# Patient Record
Sex: Female | Born: 1995 | Hispanic: Yes | Marital: Single | Smoking: Never smoker
Health system: Southern US, Community
[De-identification: ages and names within clinical notes are randomized; demographics above are authoritative.]

## PROBLEM LIST (undated history)

## (undated) DIAGNOSIS — Z8659 Personal history of other mental and behavioral disorders: Secondary | ICD-10-CM

## (undated) DIAGNOSIS — F4323 Adjustment disorder with mixed anxiety and depressed mood: Secondary | ICD-10-CM

## (undated) DIAGNOSIS — F419 Anxiety disorder, unspecified: Secondary | ICD-10-CM

## (undated) DIAGNOSIS — J3089 Other allergic rhinitis: Secondary | ICD-10-CM

## (undated) DIAGNOSIS — Z0282 Encounter for adoption services: Secondary | ICD-10-CM

## (undated) DIAGNOSIS — G479 Sleep disorder, unspecified: Secondary | ICD-10-CM

## (undated) DIAGNOSIS — L700 Acne vulgaris: Secondary | ICD-10-CM

## (undated) DIAGNOSIS — N926 Irregular menstruation, unspecified: Secondary | ICD-10-CM

## (undated) HISTORY — DX: Sleep disorder, unspecified: G47.9

## (undated) HISTORY — DX: Adjustment disorder with mixed anxiety and depressed mood: F43.23

## (undated) HISTORY — PX: ESOPHAGOGASTRODUODENOSCOPY ENDOSCOPY: SHX5814

## (undated) HISTORY — DX: Other allergic rhinitis: J30.89

## (undated) HISTORY — DX: Irregular menstruation, unspecified: N92.6

## (undated) HISTORY — DX: Anxiety disorder, unspecified: F41.9

## (undated) HISTORY — DX: Acne vulgaris: L70.0

## (undated) HISTORY — DX: Encounter for adoption services: Z02.82

## (undated) HISTORY — DX: Personal history of other mental and behavioral disorders: Z86.59

---
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
## 2017-09-16 ENCOUNTER — Encounter: Payer: Self-pay | Admitting: Family Medicine

## 2017-09-16 ENCOUNTER — Ambulatory Visit (INDEPENDENT_AMBULATORY_CARE_PROVIDER_SITE_OTHER): Admitting: Family Medicine

## 2017-09-16 VITALS — BP 100/64 | HR 74 | Ht 65.0 in | Wt 159.4 lb

## 2017-09-16 DIAGNOSIS — F4010 Social phobia, unspecified: Secondary | ICD-10-CM

## 2017-09-16 DIAGNOSIS — G479 Sleep disorder, unspecified: Secondary | ICD-10-CM

## 2017-09-16 DIAGNOSIS — F40243 Fear of flying: Secondary | ICD-10-CM | POA: Insufficient documentation

## 2017-09-16 DIAGNOSIS — Z8659 Personal history of other mental and behavioral disorders: Secondary | ICD-10-CM

## 2017-09-16 DIAGNOSIS — E663 Overweight: Secondary | ICD-10-CM | POA: Diagnosis not present

## 2017-09-16 DIAGNOSIS — J3089 Other allergic rhinitis: Secondary | ICD-10-CM | POA: Diagnosis not present

## 2017-09-16 MED ORDER — ALPRAZOLAM ER 1 MG PO TB24: ORAL_TABLET | ORAL | 0 refills | Status: DC

## 2017-09-16 MED ORDER — ESCITALOPRAM OXALATE 20 MG PO TABS: 20.0000 mg | ORAL_TABLET | Freq: Every day | ORAL | 3 refills | Status: DC

## 2017-09-16 NOTE — Progress Notes (Signed)
Impression and Recommendations:    1. Social anxiety disorder   2. Sleep disturbance   3. Over weight:  slight inc BMI   4. Mild Environmental and seasonal allergies   5. Fear of flying   6. Hx of psychiatric hospitalization     1. Anxiety Management - Patient tolerates her 20 mg dose of Lexapro well.  Continue as prescribed. - No changes made today.  Refills provided on Lexapro.  - Encouraged continued exercise, attending counseling, adding in meditation or prayer, deep breathing, adequate sleep, healthy dietary habits, along with continued medication to manage all facets of mood and anxiety.  - Patient is going on an 8 hour plane flight soon to travel abroad to [place].  Due to patient's concerns about unmanageable flight anxiety, Xanax prescribed.  Counseled patient to be careful and prudent with use, reminding her of risks, benefits, and potential side-effects of medication.  2. Seasonal Allergies - Advised the patient to begin using her neti pot to perform sinus rinses BID, followed by flonase BID (one spray to each nostril).  Advised that the patient may also incorporate allegra or claritin PRN.   3. BMI Counseling Explained to patient what BMI refers to, and what it means medically.    Told patient to think about it as a "medical risk stratification measurement" and how increasing BMI is associated with increasing risk/ or worsening state of various diseases such as hypertension, hyperlipidemia, diabetes, premature OA, depression etc.  American Heart Association guidelines for healthy diet, basically Mediterranean diet, and exercise guidelines of 30 minutes 5 days per week or more discussed in detail.  Health counseling performed.  All questions answered.  4. Lifestyle & Dietary Habits - Advised patient to continue working toward exercising to improve health.    - Encouraged patient to continue with 15 minutes of activity daily this summer.  Recommended that the patient  eventually strive for at least 150 minutes of moderate cardiovascular activity per week according to guidelines established by the [facility].   - Healthy dietary habits encouraged, including low-carb, and high amounts of lean protein in diet.   - Patient should also consume adequate amounts of water - half of body weight in oz of water per day   Education and routine counseling performed. Handouts provided.  5. Follow-Up - Continue to return for regularly scheduled chronic follow-up.  - Patient knows that she may return to the clinic as needed for any other concerns that arise.    Meds ordered this encounter  Medications  . ALPRAZolam (XANAX XR) 1 MG 24 hr tablet    Sig: 1 to 3 tabs prior to flight    Dispense:  6 tablet    Refill:  0  . escitalopram (LEXAPRO) 20 MG tablet    Sig: Take 1 tablet (20 mg total) by mouth daily.    Dispense:  90 tablet    Refill:  3    Generic OK    Gross side effects, risk and benefits, and alternatives of medications and treatment plan in general discussed with patient.  Patient is aware that all medications have potential side effects and we are unable to predict every side effect or drug-drug interaction that may occur.   Patient will call with any questions prior to using medication if they have concerns.  Expresses verbal understanding and consents to current therapy and treatment regimen.  No barriers to understanding were identified.  Red flag symptoms and signs discussed in detail.  Patient expressed understanding regarding what to do in case of emergency\urgent symptoms  Please see AVS handed out to patient at the end of our visit for further patient instructions/ counseling done pertaining to today's office visit.   Return for Six-month follow-up weight loss, mood, sleep disorder, allergies.     Note: This document was prepared using Dragon voice recognition software and may include unintentional dictation errors.  This document serves as a  record of services personally performed by Thomasene Loteborah Skyelar Halliday, DO. It was created on her behalf by Peggye FothergillKatherine Galloway, a trained medical scribe. The creation of this record is based on the scribe's personal observations and the provider's statements to them.   I have reviewed the above medical documentation for accuracy and completeness and I concur.  Thomasene LotDeborah Nyimah Shadduck 09/22/17 8:30 PM   --------------------------------------------------------------------------------------------------------------------------------------------   Subjective:    CC:  Chief Complaint  Patient presents with  . Follow-up    HPI: Sheri Kline is a 22 y.o. female who [facility] follow-up of mood.   Lifestyle & General Health Patient notes that she's been good.  Not much is new, but she has lost weight; down to 155-156 lbs.  Notes that she's lost 5 or 6 pounds, maybe even a little more.  Weighed 167 lbs a year ago.  Feels that a lot of her weight loss was due to school, finals, being at school, feeling lazy, not wanting to walk to the cafeteria, etc.  Notes she's not feeling as hungry anymore.  She works in produce, often lifting and walking.  Notes that work has been a little difficult lately, mainly due to issues with coworkers.  Other than work, she started going to the gym at school 2-3 times per week.  Anxiety Management Continues taking Lexapro, and is tolerating her 20 mg dose well.  She was also visiting a counselor to help manage her anxiety while she was at school.  Is going on an 8 hour plane flight soon to travel abroad to [place], and is concerned about flight anxiety.  Sleep She is still taking melatonin occasionally to help her sleep, but not every night.  Allergies Has not had any allergy spells in a while.    [facility] 09/16/2017 08/24/2016 02/15/2016  Decreased Interest 0 0 0  Down, Depressed, Hopeless 2 1 1   PHQ - 2 Score  2 1 1   Altered sleeping 0 1 0  Tired, decreased energy 0 1 0  Change in appetite 1 1 0  Feeling bad or failure about yourself  2 0 1  Trouble concentrating 0 0 0  Moving slowly or fidgety/restless 0 0 0  Suicidal thoughts 2 1 1   PHQ-9 Score 7 5 3   Difficult doing work/chores Somewhat difficult - -     GAD 7 : Generalized Anxiety Score 09/16/2017 08/24/2016 02/15/2016  Nervous, Anxious, on Edge 1 1 1   Control/stop worrying 1 1 0  Worry too much - different things 1 1 1   Trouble relaxing 0 0 0  Restless 0 3 0  Easily annoyed or irritable 1 1 1   Afraid - awful might happen 0 0 0  Total GAD 7 Score 4 7 3   Anxiety Difficulty - Somewhat difficult Not difficult at all     Wt Readings from Last 3 Encounters:  09/16/17 159 lb 6.4 oz (72.3 kg) (87 %, Z= 1.12)*  08/24/16 167 lb (75.8 kg) (92 %, Z= 1.38)*  02/15/16 162 lb 1.6 oz (73.5 kg) (90 %, Z= 1.31)*   * Growth percentiles are based on CDC (Girls, 2-20 Years) data.   BP Readings from Last 3 Encounters:  09/16/17 100/64  08/24/16 102/62  02/15/16 103/65   Pulse Readings from Last 3 Encounters:  09/16/17 74  08/24/16 80  02/15/16 89   BMI Readings from Last 3 Encounters:  09/16/17 26.53 kg/m (85 %, Z= 1.06)*  08/24/16 27.58 kg/m (90 %, Z= 1.28)*  02/15/16 26.77 kg/m (88 %, Z= 1.20)*   * Growth percentiles are based on CDC (Girls, 2-20 Years) data.         Patient Care Team    Relationship Specialty Notifications Start End  Thomasene Lot, DO PCP - General Family Medicine  11/09/15      Patient Active Problem List   Diagnosis Date Noted  . 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 Over weight:  slight inc BMI 09/10/2016  . 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 Irregular menstrual cycle 11/11/2015  . Mild Environmental and seasonal allergies 11/11/2015  . Amenorrhea 11/18/2014  . Acne vulgaris 09/30/2014  . BMI (body mass index), pediatric, 85% to less than 95% for age 29/09/2013  . Adopted 05/11/2013  . Generalized anxiety  disorder 04/24/2013  . Sleep disturbance 02/17/2013  . Social anxiety disorder 12/17/2012  . Adjustment disorder with mixed anxiety and depressed mood 12/17/2012  . Hx of psychiatric hospitalization 11/14/2012    Past Medical history, Surgical history, Family history, Social history, Allergies and Medications have been entered into the medical record, reviewed and changed as needed.    Current Meds  Medication Sig  . escitalopram (LEXAPRO) 20 MG tablet Take 1 tablet (20 mg total) by mouth daily.  . fexofenadine-pseudoephedrine (ALLEGRA-D ALLERGY & CONGESTION) 180-240 MG 24 hr tablet Take 1 tablet by mouth daily.  . fluticasone (FLONASE) 50 MCG/ACT nasal spray 1 spray into each nostril twice daily ( following sinus rinses)  . Melatonin 5 MG TABS Take 10 mg by mouth at bedtime.  . [DISCONTINUED] escitalopram (LEXAPRO) 20 MG tablet Take 1 tablet (20 mg total) by mouth daily.    Allergies:  Allergies  Allergen Reactions  . Amoxicillin Rash     Review of Systems: Review of Systems: General:   No F/C, wt loss Pulm:   No DIB, SOB, pleuritic chest pain Card:  No CP, palpitations Abd:  No n/v/d or pain Ext:  No inc edema from baseline Psych: no SI/ HI    Objective:   Blood pressure 100/64, pulse 74, height 5\' 5"  (1.651 m), weight 159 lb 6.4 oz (72.3 kg), last menstrual period 08/19/2017, SpO2 98 %. Body mass index is 26.53 kg/m. General:  Well Developed, well nourished, appropriate for stated age.  Neuro:  Alert and oriented,  extra-ocular muscles intact  HEENT:  Normocephalic, atraumatic, neck supple, no carotid bruits appreciated  Skin:  no gross rash, warm, pink. Cardiac:  RRR, S1 S2 Respiratory:  ECTA B/L and A/P, Not using accessory muscles, speaking in full sentences- unlabored. Vascular:  Ext warm, no cyanosis apprec.; cap RF less 2 sec. Psych:  No HI/SI, judgement and insight good, Euthymic mood. Full Affect.

## 2017-09-16 NOTE — Patient Instructions (Signed)
Alprazolam extended-relase tablets What is this medicine? ALPRAZOLAM (al PRAY zoe lam) is a benzodiazepine. It is used to treat anxiety and panic attacks. This medicine may be used for other purposes; ask your health care provider or pharmacist if you have questions. COMMON BRAND NAME(S): Xanax XR What should I tell my health care provider before I take this medicine? They need to know if you have any of these conditions: -an alcohol or drug abuse problem -bipolar disorder, depression, psychosis or other mental health conditions -glaucoma -kidney or liver disease -lung or breathing disease -myasthenia gravis -Parkinson's disease -porphyria -seizures or a history of seizures -suicidal thoughts -an unusual or allergic reaction to alprazolam, other benzodiazepines, foods, dyes, or preservatives -pregnant or trying to get pregnant -breast-feeding How should I use this medicine? Take this medicine by mouth. Follow the directions on the prescription label. Do not cut, crush, chew or divide the tablets. Swallow the tablets whole with a drink of water. Take your medicine at regular intervals. Do not take it more often than directed. Do not stop taking except on your doctor's advice. A special MedGuide will be given to you by the pharmacist with each prescription and refill. Be sure to read this information carefully each time. Talk to your pediatrician regarding the use of this medicine in children. Special care may be needed. Overdosage: If you think you have taken too much of this medicine contact a poison control center or emergency room at once. NOTE: This medicine is only for you. Do not share this medicine with others. What if I miss a dose? If you miss a dose, take it as soon as you can. If it is almost time for your next dose, take only that dose. Do not take double or extra doses. What may interact with this medicine? Do not take this medicine with any of the following  medications: -certain antiviral medicines for HIV or AIDS like delavirdine, indinavir -certain medicines for fungal infections like ketoconazole and itraconazole -narcotic medicines for cough -sodium oxybate This medicine may also interact with the following medications: -alcohol -antihistamines for allergy, cough and cold -certain antibiotics like clarithromycin, erythromycin, isoniazid, rifampin, rifapentine, rifabutin, and troleandomycin -certain medicines for blood pressure, heart disease, irregular heart beat -certain medicines for depression, like amitriptyline, fluoxetine, sertraline -certain medicines for seizures like carbamazepine, oxcarbazepine, phenobarbital, phenytoin, primidone -cimetidine -cyclosporine -female hormones, like estrogens or progestins and birth control pills, patches, rings, or injections -general anesthetics like halothane, isoflurane, methoxyflurane, propofol -grapefruit juice -local anesthetics like lidocaine, pramoxine, tetracaine -medicines that relax muscles for surgery -narcotic medicines for pain -other antiviral medicines for HIV or AIDS -phenothiazines like chlorpromazine, mesoridazine, prochlorperazine, thioridazine This list may not describe all possible interactions. Give your health care provider a list of all the medicines, herbs, non-prescription drugs, or dietary supplements you use. Also tell them if you smoke, drink alcohol, or use illegal drugs. Some items may interact with your medicine. What should I watch for while using this medicine? Tell your doctor or health care professional if your symptoms do not start to get better or if they get worse. Do not stop taking except on your doctor's advice. You may develop a severe reaction. Your doctor will tell you how much medicine to take. You may get drowsy or dizzy. Do not drive, use machinery, or do anything that needs mental alertness until you know how this medicine affects you. To reduce the  risk of dizzy and fainting spells, do not stand or sit up quickly,  especially if you are an older patient. Alcohol may increase dizziness and drowsiness. Avoid alcoholic drinks. If you are taking another medicine that also causes drowsiness, you may have more side effects. Give your health care provider a list of all medicines you use. Your doctor will tell you how much medicine to take. Do not take more medicine than directed. Call emergency for help if you have problems breathing or unusual sleepiness. What side effects may I notice from receiving this medicine? Side effects that you should report to your doctor or health care professional as soon as possible: -allergic reactions like skin rash, itching or hives, swelling of the face, lips, or tongue -breathing problems -confusion -loss of balance or coordination -signs and symptoms of low blood pressure like dizziness; feeling faint or lightheaded, falls; unusually weak or tired -suicidal thoughts or other mood changes Side effects that usually do not require medical attention (report to your doctor or health care professional if they continue or are bothersome): -dizziness -dry mouth -nausea, vomiting -tiredness This list may not describe all possible side effects. Call your doctor for medical advice about side effects. You may report side effects to FDA at 1-800-FDA-1088. Where should I keep my medicine? Keep out of the reach of children. This medicine can be abused. Keep your medicine in a safe place to protect it from theft. Do not share this medicine with anyone. Selling or giving away this medicine is dangerous and against the law. This medicine may cause accidental overdose and death if taken by other adults, children, or pets. Mix any unused medicine with a substance like cat litter or coffee grounds. Then throw the medicine away in a sealed container like a sealed bag or a coffee can with a lid. Do not use the medicine after the  expiration date. Store at room temperature between 15 and 30 degrees C (59 and 86 degrees F). NOTE: This sheet is a summary. It may not cover all possible information. If you have questions about this medicine, talk to your doctor, pharmacist, or health care provider.  2018 Elsevier/Gold Standard (2014-12-30 13:57:39)     Generalized Anxiety Disorder, Adult  Generalized anxiety disorder (GAD) is a mental health disorder. People with this condition constantly worry about everyday events. Unlike normal anxiety, worry related to GAD is not triggered by a specific event. These worries also do not fade or get better with time. GAD interferes with life functions, including relationships, work, and school. GAD can vary from mild to severe. People with severe GAD can have intense waves of anxiety with physical symptoms (panic attacks). What are the causes? The exact cause of GAD is not known. What increases the risk? This condition is more likely to develop in:  Women.  People who have a family history of anxiety disorders.  People who are very shy.  People who experience very stressful life events, such as the death of a loved one.  People who have a very stressful family environment.  What are the signs or symptoms? People with GAD often worry excessively about many things in their lives, such as their health and family. They may also be overly concerned about:  Doing well at work.  Being on time.  Natural disasters.  Friendships.  Physical symptoms of GAD include:  Fatigue.  Muscle tension or having muscle twitches.  Trembling or feeling shaky.  Being easily startled.  Feeling like your heart is pounding or racing.  Feeling out of breath or like you cannot  take a deep breath.  Having trouble falling asleep or staying asleep.  Sweating.  Nausea, diarrhea, or irritable bowel syndrome (IBS).  Headaches.  Trouble concentrating or remembering  facts.  Restlessness.  Irritability.  How is this diagnosed? Your health care provider can diagnose GAD based on your symptoms and medical history. You will also have a physical exam. The health care provider will ask specific questions about your symptoms, including how severe they are, when they started, and if they come and go. Your health care provider may ask you about your use of alcohol or drugs, including prescription medicines. Your health care provider may refer you to a mental health specialist for further evaluation. Your health care provider will do a thorough examination and may perform additional tests to rule out other possible causes of your symptoms. To be diagnosed with GAD, a person must have anxiety that:  Is out of his or her control.  Affects several different aspects of his or her life, such as work and relationships.  Causes distress that makes him or her unable to take part in normal activities.  Includes at least three physical symptoms of GAD, such as restlessness, fatigue, trouble concentrating, irritability, muscle tension, or sleep problems.  Before your health care provider can confirm a diagnosis of GAD, these symptoms must be present more days than they are not, and they must last for six months or longer. How is this treated? The following therapies are usually used to treat GAD:  Medicine. Antidepressant medicine is usually prescribed for long-term daily control. Antianxiety medicines may be added in severe cases, especially when panic attacks occur.  Talk therapy (psychotherapy). Certain types of talk therapy can be helpful in treating GAD by providing support, education, and guidance. Options include: ? Cognitive behavioral therapy (CBT). People learn coping skills and techniques to ease their anxiety. They learn to identify unrealistic or negative thoughts and behaviors and to replace them with positive ones. ? Acceptance and commitment therapy (ACT).  This treatment teaches people how to be mindful as a way to cope with unwanted thoughts and feelings. ? Biofeedback. This process trains you to manage your body's response (physiological response) through breathing techniques and relaxation methods. You will work with a therapist while machines are used to monitor your physical symptoms.  Stress management techniques. These include yoga, meditation, and exercise.  A mental health specialist can help determine which treatment is best for you. Some people see improvement with one type of therapy. However, other people require a combination of therapies. Follow these instructions at home:  Take over-the-counter and prescription medicines only as told by your health care provider.  Try to maintain a normal routine.  Try to anticipate stressful situations and allow extra time to manage them.  Practice any stress management or self-calming techniques as taught by your health care provider.  Do not punish yourself for setbacks or for not making progress.  Try to recognize your accomplishments, even if they are small.  Keep all follow-up visits as told by your health care provider. This is important. Contact a health care provider if:  Your symptoms do not get better.  Your symptoms get worse.  You have signs of depression, such as: ? A persistently sad, cranky, or irritable mood. ? Loss of enjoyment in activities that used to bring you joy. ? Change in weight or eating. ? Changes in sleeping habits. ? Avoiding friends or family members. ? Loss of energy for normal tasks. ? Feelings of  guilt or worthlessness. Get help right away if:  You have serious thoughts about hurting yourself or others. If you ever feel like you may hurt yourself or others, or have thoughts about taking your own life, get help right away. You can go to your nearest emergency department or call:  Your local emergency services (911 in the [place]  A suicide  crisis helpline, such as the National Suicide Prevention Lifeline at 973-300-8372. This is open 24 hours a day.  Summary  Generalized anxiety disorder (GAD) is a mental health disorder that involves worry that is not triggered by a specific event.  People with GAD often worry excessively about many things in their lives, such as their health and family.  GAD may cause physical symptoms such as restlessness, trouble concentrating, sleep problems, frequent sweating, nausea, diarrhea, headaches, and trembling or muscle twitching.  A mental health specialist can help determine which treatment is best for you. Some people see improvement with one type of therapy. However, other people require a combination of therapies. This information is not intended to replace advice given to you by your health care provider. Make sure you discuss any questions you have with your health care provider. Document Released: 07/28/2012 Document Revised: 02/21/2016 Document Reviewed: 02/21/2016 Elsevier Interactive Patient Education  2018 [employer].      Living With Anxiety  After being diagnosed with an anxiety disorder, you may be relieved to know why you have felt or behaved a certain way. It is natural to also feel overwhelmed about the treatment ahead and what it will mean for your life. With care and support, you can manage this condition and recover from it. How to cope with anxiety Dealing with stress Stress is your body's reaction to life changes and events, both good and bad. Stress can last just a few hours or it can be ongoing. Stress can play a major role in anxiety, so it is important to learn both how to cope with stress and how to think about it differently. Talk with your health care provider or a counselor to learn more about stress reduction. He or she may suggest some stress reduction techniques, such as:  Music therapy. This can include creating or listening to music that you enjoy and  that inspires you.  Mindfulness-based meditation. This involves being aware of your normal breaths, rather than trying to control your breathing. It can be done while sitting or walking.  Centering prayer. This is a kind of meditation that involves focusing on a word, phrase, or sacred image that is meaningful to you and that brings you peace.  Deep breathing. To do this, expand your stomach and inhale slowly through your nose. Hold your breath for 3-5 seconds. Then exhale slowly, allowing your stomach muscles to relax.  Self-talk. This is a skill where you identify thought patterns that lead to anxiety reactions and correct those thoughts.  Muscle relaxation. This involves tensing muscles then relaxing them.  Choose a stress reduction technique that fits your lifestyle and personality. Stress reduction techniques take time and practice. Set aside 5-15 minutes a day to do them. Therapists can offer training in these techniques. The training may be covered by some insurance plans. Other things you can do to manage stress include:  Keeping a stress diary. This can help you learn what triggers your stress and ways to control your response.  Thinking about how you respond to certain situations. You may not be able to control everything, but you  can control your reaction.  Making time for activities that help you relax, and not feeling guilty about spending your time in this way.  Therapy combined with coping and stress-reduction skills provides the best chance for successful treatment. Medicines Medicines can help ease symptoms. Medicines for anxiety include:  Anti-anxiety drugs.  Antidepressants.  Beta-blockers.  Medicines may be used as the main treatment for anxiety disorder, along with therapy, or if other treatments are not working. Medicines should be prescribed by a health care provider. Relationships Relationships can play a big part in helping you recover. Try to spend more time  connecting with trusted friends and family members. Consider going to couples counseling, taking family education classes, or going to family therapy. Therapy can help you and others better understand the condition. How to recognize changes in your condition Everyone has a different response to treatment for anxiety. Recovery from anxiety happens when symptoms decrease and stop interfering with your daily activities at home or work. This may mean that you will start to:  Have better concentration and focus.  Sleep better.  Be less irritable.  Have more energy.  Have improved memory.  It is important to recognize when your condition is getting worse. Contact your health care provider if your symptoms interfere with home or work and you do not feel like your condition is improving. Where to find help and support: You can get help and support from these sources:  Self-help groups.  Online and [employer] organizations.  A trusted spiritual leader.  Couples counseling.  Family education classes.  Family therapy.  Follow these instructions at home:  Eat a healthy diet that includes plenty of vegetables, fruits, whole grains, low-fat dairy products, and lean protein. Do not eat a lot of foods that are high in solid fats, added sugars, or salt.  Exercise. Most adults should do the following: ? Exercise for at least 150 minutes each week. The exercise should increase your heart rate and make you sweat (moderate-intensity exercise). ? Strengthening exercises at least twice a week.  Cut down on caffeine, tobacco, alcohol, and other potentially harmful substances.  Get the right amount and quality of sleep. Most adults need 7-9 hours of sleep each night.  Make choices that simplify your life.  Take over-the-counter and prescription medicines only as told by your health care provider.  Avoid caffeine, alcohol, and certain over-the-counter cold medicines. These may make you feel worse.  Ask your pharmacist which medicines to avoid.  Keep all follow-up visits as told by your health care provider. This is important. Questions to ask your health care provider  Would I benefit from therapy?  How often should I follow up with a health care provider?  How long do I need to take medicine?  Are there any long-term side effects of my medicine?  Are there any alternatives to taking medicine? Contact a health care provider if:  You have a hard time staying focused or finishing daily tasks.  You spend many hours a day feeling worried about everyday life.  You become exhausted by worry.  You start to have headaches, feel tense, or have nausea.  You urinate more than normal.  You have diarrhea. Get help right away if:  You have a racing heart and shortness of breath.  You have thoughts of hurting yourself or others. If you ever feel like you may hurt yourself or others, or have thoughts about taking your own life, get help right away. You can go to  your nearest emergency department or call:  Your local emergency services (911 in the [place]  A suicide crisis helpline, such as the National Suicide Prevention Lifeline at 951-449-1178. This is open 24-hours a day.  Summary  Taking steps to deal with stress can help calm you.  Medicines cannot cure anxiety disorders, but they can help ease symptoms.  Family, friends, and partners can play a big part in helping you recover from an anxiety disorder. This information is not intended to replace advice given to you by your health care provider. Make sure you discuss any questions you have with your health care provider. Document Released: 03/27/2016 Document Revised: 03/27/2016 Document Reviewed: 03/27/2016 Elsevier Interactive Patient Education  [employer].

## 2018-01-08 ENCOUNTER — Encounter: Payer: Self-pay | Admitting: Family Medicine

## 2018-01-08 ENCOUNTER — Ambulatory Visit (INDEPENDENT_AMBULATORY_CARE_PROVIDER_SITE_OTHER): Admitting: Family Medicine

## 2018-01-08 VITALS — BP 103/67 | HR 73 | Ht 65.0 in | Wt 160.5 lb

## 2018-01-08 DIAGNOSIS — F39 Unspecified mood [affective] disorder: Secondary | ICD-10-CM | POA: Diagnosis not present

## 2018-01-08 DIAGNOSIS — R45851 Suicidal ideations: Secondary | ICD-10-CM

## 2018-01-08 NOTE — Progress Notes (Signed)
Impression and Recommendations:    1. Mood disorder (HCC)   2. Suicidal ideations     Mood -Referred pt to psychiatry -Pt has had suicidal ideations but is not feeling that way currently -Verbal contract with handshake entered with patient that she said she would not hurt herself and would dial 911 or go to the emergency room if she had those thoughts /feelings in the future -Strongly encouraged pt to call 911 if she feels like she can't handle her life and to consider inpatient psychiatric care if she is having problems -Strongly encouraged pt to continue seeing the counselor at her [facility] and not to be discouraged by the wait time -Explained red flag symptoms of suicidal ideations and to call 911 immediately  -Discussed the importance of exercise to help improve her moods -Recommended 30-45 min of exercise every day  -Discussed the negative impact of poor diet and lacking exercise on dealing with stress -Encouraged pt to continue making non-pharmaceutical changes to help deal with stress as well  -Discussed the negative impact of certain antidepressants on bipolar disease -Contacted psychiatry for a consult and testing  -Instructed pt to call before she begins feeling poorly and not to wait until dire circumstances to reach out for help  -Discussed the need for specialists to diagnose bipolar disease -Discussed the importance of counseling to help with working through mood and changes   Education and routine counseling performed. Handouts provided. Medications Discontinued During This Encounter  Medication Reason  . ALPRAZolam (XANAX XR) 1 MG 24 hr tablet No longer needed (for PRN medications)     Gross side effects, risk and benefits, and alternatives of medications and treatment plan in general discussed with patient.  Patient is aware that all medications have potential side effects and we are unable to predict every side effect or drug-drug interaction that may  occur.   Patient will call with any questions prior to using medication if they have concerns.  Expresses verbal understanding and consents to current therapy and treatment regimen.  No barriers to understanding were identified.  Red flag symptoms and signs discussed in detail.  Patient expressed understanding regarding what to do in case of emergency\urgent symptoms  Please see AVS handed out to patient at the end of our visit for further patient instructions/ counseling done pertaining to today's office visit.   Return if symptoms worsen or fail to improve, for Please follow-up with me on yearly basis; follow-up with psychiatry regarding mood and sleep.     Note:  This document was prepared using Dragon voice recognition software and may include unintentional dictation errors.   This document serves as a record of services personally performed by Thomasene Lot, MD. It was created on her behalf by Alphonse Guild, a trained medical scribe. The creation of this record is based on the scribe's personal observations and the provider's statements to them.   I have reviewed the above medical documentation for accuracy and completeness and I concur.  Thomasene Lot 01/08/18 2:54 PM   --------------------------------------------------------------------------------------------------------------------------------------------------------------------------------------------------------------------------------------------    Subjective:    CC:  Chief Complaint  Patient presents with  . Anxiety  . Depression    HPI: Sheri Kline is a 22 y.o. female who presents to [facility] today for follow-up of mood.   Mother is also present as additional historian: -States she's had a lot of ups and downs in her mood -Has been experiencing several bad days  -  Stated after the one xanax, she felt improvement when it happened but the after effects were terrible -states she  has been really hard on herself and gets in these moods where she wants to stay in her room; and on a hour to hour basis she can get different text from her child stating she feels fine one moment and then feels really depressed the next -Is interested in her daughter seeing a psychiatrist for evaluation- never had official eval from them! -States she feels like sometimes the calls for help are just for attention  Sleep -Pt states she is not having problems sleeping -States she is a light sleeper but she has been sleeping her usual amount not over or under sleeping.  Mood -States she has been having these moments where she feels like she's going from very happy to very sad in a few minutes -States she has told her mother she feels good sometimes but she knows it will change soon -Pt recently discussed the possibility of being bipolar with her mother -States she was diagnosed as anxiety and depression at 22 yo -Pt has given her mother a bag of exacto knife and bandages she bought to hurt herself  -States she has come up with a plan for suicide but hasn't followed through and told her mother to take the items she planned on using.  - Currently no thoughts or plans   Depression screen [facility] 2/9 01/08/2018 09/16/2017 08/24/2016  Decreased Interest 2 0 0  Down, Depressed, Hopeless 2 2 1   PHQ - 2 Score 4 2 1   Altered sleeping 1 0 1  Tired, decreased energy 2 0 1  Change in appetite 2 1 1   Feeling bad or failure about yourself  2 2 0  Trouble concentrating 2 0 0  Moving slowly or fidgety/restless 0 0 0  Suicidal thoughts 2 2 1   PHQ-9 Score 15 7 5   Difficult doing work/chores Somewhat difficult Somewhat difficult -     GAD 7 : Generalized Anxiety Score 01/08/2018 09/16/2017 08/24/2016 02/15/2016  Nervous, Anxious, on Edge 2 1 1 1   Control/stop worrying 2 1 1  0  Worry too much - different things 2 1 1 1   Trouble relaxing 1 0 0 0  Restless 1 0 3 0  Easily annoyed or irritable 1 1 1 1   Afraid -  awful might happen 1 0 0 0  Total GAD 7 Score 10 4 7 3   Anxiety Difficulty Somewhat difficult - Somewhat difficult Not difficult at all     Wt Readings from Last 3 Encounters:  01/08/18 160 lb 8 oz (72.8 kg)  09/16/17 159 lb 6.4 oz (72.3 kg) (87 %, Z= 1.12)*  08/24/16 167 lb (75.8 kg) (92 %, Z= 1.38)*   * Growth percentiles are based on CDC (Girls, 2-20 Years) data.   BP Readings from Last 3 Encounters:  01/08/18 103/67  09/16/17 100/64  08/24/16 102/62   Pulse Readings from Last 3 Encounters:  01/08/18 73  09/16/17 74  08/24/16 80   BMI Readings from Last 3 Encounters:  01/08/18 26.71 kg/m  09/16/17 26.53 kg/m (85 %, Z= 1.06)*  08/24/16 27.58 kg/m (90 %, Z= 1.28)*   * Growth percentiles are based on CDC (Girls, 2-20 Years) data.         Patient Care Team    Relationship Specialty Notifications Start End  Thomasene Lot, DO PCP - General Family Medicine  11/09/15      Patient Active Problem List  Diagnosis Date Noted  . Mood disorder (HCC) 01/08/2018  . 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 Over weight:  slight inc BMI 09/10/2016  . 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 Irregular menstrual cycle 11/11/2015  . Mild Environmental and seasonal allergies 11/11/2015  . Amenorrhea 11/18/2014  . Acne vulgaris 09/30/2014  . BMI (body mass index), pediatric, 85% to less than 95% for age 99/09/2013  . Adopted 05/11/2013  . Generalized anxiety disorder 04/24/2013  . Sleep disturbance 02/17/2013  . Social anxiety disorder 12/17/2012  . Adjustment disorder with mixed anxiety and depressed mood 12/17/2012  . Suicidal ideations 12/12/2012  . Hx of psychiatric hospitalization 11/14/2012    Past Medical history, Surgical history, Family history, Social history, Allergies and Medications have been entered into the medical record, reviewed and changed as needed.    Current Meds  Medication Sig  . escitalopram (LEXAPRO) 20 MG tablet Take 1 tablet (20 mg total) by mouth daily.  .  fexofenadine-pseudoephedrine (ALLEGRA-D ALLERGY & CONGESTION) 180-240 MG 24 hr tablet Take 1 tablet by mouth daily.  . fluticasone (FLONASE) 50 MCG/ACT nasal spray 1 spray into each nostril twice daily ( following sinus rinses)  . Melatonin 5 MG TABS Take 10 mg by mouth at bedtime.    Allergies:  Allergies  Allergen Reactions  . Amoxicillin Rash     Review of Systems: Review of Systems: General:   No F/C, wt loss Pulm:   No DIB, SOB, pleuritic chest pain Card:  No CP, palpitations Abd:  No n/v/d or pain Ext:  No inc edema from baseline Psych: no SI/ HI    Objective:   Blood pressure 103/67, pulse 73, height 5\' 5"  (1.651 m), weight 160 lb 8 oz (72.8 kg), last menstrual period 12/18/2017, SpO2 100 %. Body mass index is 26.71 kg/m. General:  Well Developed, well nourished, appropriate for stated age.  Neuro:  Alert and oriented,  extra-ocular muscles intact  HEENT:  Normocephalic, atraumatic, neck supple, no carotid bruits appreciated  Skin:  no gross rash, warm, pink. Cardiac:  RRR, S1 S2 Respiratory:  ECTA B/L and A/P, Not using accessory muscles, speaking in full sentences- unlabored. Vascular:  Ext warm, no cyanosis apprec.; cap RF less 2 sec. Psych:  No HI/SI, judgement and insight good, Euthymic mood. Full Affect.

## 2018-01-08 NOTE — Patient Instructions (Signed)
Suicidal Feelings: How to Help Yourself  Suicide is the taking of one's own life. If you feel as though life is getting too tough to handle and are thinking about suicide, get help right away. To get help:  Call your local emergency services (911 in the [place]  Call a suicide hotline to speak with a trained counselor who understands how you are feeling. The following is a list of suicide hotlines in the [place]. For a list of hotlines in [place], visit www.suicide.org/hotlines/international/[place]-suicide-hotlines.html.  1-800-273-TALK (1-800-273-8255).  1-800-SUICIDE (1-800-784-2433).  1-888-628-9454. This is a hotline for Spanish speakers.  1-800-799-4TTY (1-800-799-4889). This is a hotline for TTY users.  1-866-4-U-TREVOR (1-866-488-7386). This is a hotline for lesbian, gay, bisexual, transgender, or questioning youth.  Contact a crisis center or a local suicide prevention center. To find a crisis center or suicide prevention center:  Call your local hospital, clinic, community service organization, mental health center, social service provider, or health department. Ask for assistance in connecting to a crisis center.  Visit www.suicidepreventionlifeline.org/getinvolved/locator for a list of crisis centers in the [place], or visit www.suicideprevention.[place]/thinking-about-suicide/find-a-crisis-centre for a list of centers in [place].  Visit the following websites:  National Suicide Prevention Lifeline: www.suicidepreventionlifeline.org  Hopeline: www.hopeline.com  American Foundation for Suicide Prevention: www.afsp.org  The Trevor Project (for lesbian, gay, bisexual, transgender, or questioning youth): www.thetrevorproject.org    How can I help myself feel better?  Promise yourself that you will not do anything drastic when you have suicidal feelings. Remember, there is hope. Many people have gotten through suicidal thoughts and feelings, and you will, too. You may have gotten through them before, and  this proves that you can get through them again.  Let family, friends, teachers, or counselors know how you are feeling. Try not to isolate yourself from those who care about you. Remember, they will want to help you. Talk with someone every day, even if you do not feel sociable. Face-to-face conversation is best.  Call a mental health professional and see one regularly.  Visit your primary health care provider every year.  Eat a well-balanced diet, and space your meals so you eat regularly.  Get plenty of rest.  Avoid alcohol and drugs, and remove them from your home. They will only make you feel worse.  If you are thinking of taking a lot of medicine, give your medicine to someone who can give it to you one day at a time. If you are on antidepressants and are concerned you will overdose, let your health care provider know so he or she can give you safer medicines. Ask your mental health professional about the possible side effects of any medicines you are taking.  Remove weapons, poisons, knives, and anything else that could harm you from your home.  Try to stick to routines. Follow a schedule every day. Put self-care on your schedule.  Make a list of realistic goals, and cross them off when you achieve them. Accomplishments give a sense of worth.  Wait until you are feeling better before doing the things you find difficult or unpleasant.  Exercise if you are able. You will feel better if you exercise for even a half hour each day.  Go out in the sun or into nature. This will help you recover from depression faster. If you have a favorite place to walk, go there.  Do the things that have always given you pleasure. Play your favorite music, read a good book, paint a picture, play your favorite   instrument, or do anything else that takes your mind off your depression if it is safe to do.  Keep your living space well lit.  When you are feeling well, write yourself a letter about tips and support that you can read when  you are not feeling well.  Remember that life's difficulties can be sorted out with help. Conditions can be treated. You can work on thoughts and strategies that serve you well.  This information is not intended to replace advice given to you by your health care provider. Make sure you discuss any questions you have with your health care provider.  Document Released: 10/07/2002 Document Revised: 11/30/2015 Document Reviewed: 07/28/2013  [employer] Interactive Patient Education  2018 [employer]

## 2018-01-16 ENCOUNTER — Other Ambulatory Visit: Payer: Self-pay

## 2018-01-16 ENCOUNTER — Ambulatory Visit (INDEPENDENT_AMBULATORY_CARE_PROVIDER_SITE_OTHER): Admitting: Child and Adolescent Psychiatry

## 2018-01-16 ENCOUNTER — Encounter: Payer: Self-pay | Admitting: Child and Adolescent Psychiatry

## 2018-01-16 VITALS — BP 103/67 | HR 76 | Temp 98.9°F | Wt 160.6 lb

## 2018-01-16 DIAGNOSIS — R45851 Suicidal ideations: Secondary | ICD-10-CM | POA: Diagnosis not present

## 2018-01-16 DIAGNOSIS — F418 Other specified anxiety disorders: Secondary | ICD-10-CM | POA: Diagnosis not present

## 2018-01-16 DIAGNOSIS — F332 Major depressive disorder, recurrent severe without psychotic features: Secondary | ICD-10-CM

## 2018-01-16 MED ORDER — ESCITALOPRAM OXALATE 5 MG PO TABS: 5.0000 mg | ORAL_TABLET | Freq: Every day | ORAL | 0 refills | Status: DC

## 2018-01-16 MED ORDER — ESCITALOPRAM OXALATE 20 MG PO TABS: 20.0000 mg | ORAL_TABLET | Freq: Every day | ORAL | 0 refills | Status: DC

## 2018-01-16 NOTE — Progress Notes (Signed)
Psychiatric Initial Adult Assessment   Patient Identification: Sheri Kline MRN:  [id number] Date of Evaluation:  01/16/2018 Referral Source: Sheri Lot, DO(Pt's PCP) Chief Complaint:  "more struggling with depression" Chief Complaint    Establish Care; Depression; Anxiety; Panic Attack; Fatigue; Stress     Visit Diagnosis:    ICD-10-CM   1. Other specified anxiety disorders F41.8   2. Severe episode of recurrent major depressive disorder, without psychotic features (HCC) F33.2   3. Suicidal ideations R45.851     History of Present Illness: This is a 22 year old Hispanic American, adopted female currently domiciled with a roommate at [employer], with no significant medical history and psychiatric history significant of anxiety, depression and 1 previous psychiatric hospitalization at [facility] for anxiety, depression and suicidal ideations at the age of 77 referred by patient's PCP for psychiatric evaluation of worsening of anxiety and depression.  Sheri Kline presented on time for her scheduled appointment and was accompanied with her mother.  She was initially seen and evaluated alone and with her consent mother was brought into the office to obtain collateral information and discuss treatment plan.    My appeared anxious however was calm and cooperative during the evaluation.  She reported that she was diagnosed with anxiety and depression with the age of 57 when she was admitted to [facility] and over the years she has been in and out of therapy which has helped her with her anxiety and depression however she has been feeling increasingly depressed over the last 1 week and having intermittent suicidal thoughts.  She reported that around 3 weeks ago when she returned from her international education trip to [place] for 10 days she found out that her female co-worker at [employer] she had previously asked to go out on dathe with her and declined her offer and started dating with another  co-worker which brought on the feelings of poor self-worth, self doubts and increased her depression and anxiety.   She reported that since this she started having suicidal thoughts which are intermittent, more passive SI or thoughts of cutting occurring once a day then active SI(occuring twice a week for few minutes). She denies any intent or plan to act on these thoughts and reported that closest she ever got to this is when she put a box cutter knife to her wrist to cut in the context of self harm thought and not suicidal thought. She reported that she thinks about her adoptive mother which stops her from acting on these thoughts. She reported previously cutting self once in high school and denied any other incident of self harm thoughts or suicide attempt. She reported that these thoughts are usually triggered when she is more depressed which she describes as feeling worthless, not feeling good enough, not wanting to do anything, down and tired, with depressed mood. She also reports poor appetite, and has difficulties with concentration.   She reported that prior to this she was depressed but felt it was manageable with the skills she had learnt through years of therapy. She reported depression started around age 15-15 when she first started having difficulties with school, did not feel good enough and felt lonely. She reported that since then depression has been intermittent through the years.   In regards of anxiety she reports that she has been struggling with anxiety since elementary school. She describes her anxiety as "Kline of worries... Lots of what ifs..." and reports that her three main worries are 1.  Fear of Failure 2. Not being good enough and 3. Loosing her mother. She reports generalized, and social anxieties with intermittent panic attacks. She reports that anxiety is more manageable.   She denies any past or current symptoms of mania, denies any current or past AVH, did not admit any  delusions, denied hx of OCD or symptoms of eating disorder.   Pt's mother provided collateral information and reported that pt has always been hard on her self and therefore has Kline of anxieties and depression. She reported that pt has been struggling with depression and anxiety since age 74-15, for which she had been in therapy on and off, and seems to have worsening of depression and anxiety recently. She was informed about pt's suicidal thoughts recently with pt's permission and provided psychoeducaton to check in with her frequently and bring pt to ER or call 911 for any immediate safety concerns. She reported that she does not want Sheri Kline to think that medication is a quick fix to her problem and therefore has reservation on increasing medication dose or adding another medication. Psychoeducation was provided to mother and pt on risk and benefits of medications.   Associated Signs/Symptoms: Depression Symptoms:  depressed mood, anhedonia, insomnia, psychomotor agitation, psychomotor retardation, fatigue, feelings of worthlessness/guilt, difficulty concentrating, hopelessness, recurrent thoughts of death, anxiety, panic attacks, loss of energy/fatigue, (Hypo) Manic Symptoms:  Irritable Mood, Anxiety Symptoms:  Excessive Worry, Panic Symptoms, Social Anxiety, Psychotic Symptoms:  None reported PTSD Symptoms: NA  Past Psychiatric History:   Inpatient: [facility] at the age of 64 for depression with SI and anxiety RTC: None reported Outpatient:     - Meds: On Lexapro since 2014, currently taking 20 mg daily, no other med trials, trialed Xanax for anxiety with flying, had racing thoughts next day and did not take it anymore.    - Therapy: Intermittent ind therapy since 2014, last was in therapy last year through college for 3 months, found it helpful, and has scheduled appointment at the college tomorrow. Hx of SI/HI: Reports hx of SI as mentioned in HPI, hx of cutting once, no  other previous self harm attempt or suicide attempt. No hx of violence reported.    Previous Psychotropic Medications: Yes   Substance Abuse History in the last 12 months:  No.  Consequences of Substance Abuse: NA  Past Medical History:  Past Medical History:  Diagnosis Date  . Acne vulgaris 09/30/2014  . Adjustment disorder with mixed anxiety and depressed mood 12/17/2012  . Adopted 05/11/2013  . Anxiety   . Hx of psychiatric hospitalization 11/2012  . Irregular menstrual cycle 11/11/2015  . Mild Environmental and seasonal allergies 11/11/2015  . Sleep disturbance 02/17/2013    Past Surgical History:  Procedure Laterality Date  . ESOPHAGOGASTRODUODENOSCOPY ENDOSCOPY      Family Psychiatric History: PT was adopted at the age of 6-8 months, and therefore no family hx available except that adoptive mother reported that pt's father most likely had alcohol abuse hx.   Family History:  Family History  Adopted: Yes  Problem Relation Age of Onset  . Alcohol abuse Father     Social History:   Social History   Socioeconomic History  . Marital status: Single    Spouse name: Not on file  . Number of children: 0  . Years of education: Not on file  . Highest education level: Associate degree: occupational, Scientist, product/process development, or vocational program  Occupational History  . Not on file  Social Needs  .  Financial resource strain: Not hard at all  . Food insecurity:    Worry: Never true    Inability: Never true  . Transportation needs:    Medical: No    Non-medical: No  Tobacco Use  . Smoking status: Never Smoker  . Smokeless tobacco: Never Used  Substance and Sexual Activity  . Alcohol use: No  . Drug use: No  . Sexual activity: Never    Birth control/protection: None  Lifestyle  . Physical activity:    Days per week: 4 days    Minutes per session: 60 min  . Stress: To some extent  Relationships  . Social connections:    Talks on phone: Not on file    Gets together: Not on file     Attends religious service: 1 to 4 times per year    Active member of club or organization: No    Attends meetings of clubs or organizations: Never    Relationship status: Never married  Other Topics Concern  . Not on file  Social History Narrative  . Not on file    Additional Social History: Patient was adopted at the age of 27 months from [place] and has been living with her adoptive parents since then.  She also has a younger adopted brother was also adopted by adoptive parents from [place].  According to mother she knew patient since 80 weeks of age.  She reported that patient was living with foster mother who was "wonderful" until 50 months of age when they adopted pt. patient's mother denies any knowledge of family history of was history.  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  She lives on campus when she is in school and lives with her parents when she is off of school.  Allergies:   Allergies  Allergen Reactions  . Amoxicillin Rash    Metabolic Disorder Labs: Lab Results  Component Value Date   HGBA1C 5.1 11/18/2014   MPG 100 11/18/2014   Lab Results  Component Value Date   PROLACTIN 17.6 11/18/2014   Lab Results  Component Value Date   CHOL 112 09/30/2014   TRIG 126 09/30/2014   HDL 37 09/30/2014   CHOLHDL 3.0 09/30/2014   VLDL 25 09/30/2014   LDLCALC 50 09/30/2014     Current Medications: Current Outpatient Medications  Medication Sig Dispense Refill  . escitalopram (LEXAPRO) 20 MG tablet Take 1 tablet (20 mg total) by mouth daily. 90 tablet 3  . fexofenadine-pseudoephedrine (ALLEGRA-D ALLERGY & CONGESTION) 180-240 MG 24 hr tablet Take 1 tablet by mouth daily. 90 tablet 4  . fluticasone (FLONASE) 50 MCG/ACT nasal spray 1 spray into each nostril twice daily ( following sinus rinses) 16 g 11  . Melatonin 5 MG TABS Take 10 mg by mouth at bedtime.     No current facility-administered medications for this visit.      Neurologic: Headache: No Seizure: No Paresthesias:NA  Musculoskeletal:  Gait & Station: normal Patient leans: N/A  Psychiatric Specialty Exam: Review of Systems  Constitutional: Negative for fever.  HENT: Negative.   Eyes: Negative for discharge and redness.  Respiratory: Negative.   Cardiovascular: Negative.   Gastrointestinal: Negative.   Musculoskeletal: Negative.   Skin: Negative.   Neurological: Negative for seizures.  Endo/Heme/Allergies: Negative.   Psychiatric/Behavioral: Positive for depression and suicidal ideas. Negative for hallucinations and substance abuse. The patient is nervous/anxious and has insomnia.     Blood pressure 103/67, pulse 76, temperature  98.9 F (37.2 C), temperature source Oral, weight 160 lb 9.6 oz (72.8 kg), last menstrual period 12/18/2017.Body mass index is 26.73 kg/m.  General Appearance: Casual and Well Groomed  Eye Contact:  Good  Speech:  Clear and Coherent and Normal Rate  Volume:  Normal  Mood:  Anxious  Affect:  Appropriate, Congruent and Full Range  Thought Process:  Goal Directed and Linear  Orientation:  Full (Time, Place, and Person)  Thought Content:  Logical  Suicidal Thoughts:    Reports intermittent SI as mentioned above without intent or plan.  Homicidal Thoughts:  No  Memory:  Immediate;   Fair Recent;   Good Remote;   Good  Judgement:  Good  Insight:  Good  Psychomotor Activity:  Normal  Concentration:  Concentration: Good and Attention Span: Good  Recall:  Good  Fund of Knowledge:Good  Language: Good  Akathisia:  NA    AIMS (if indicated):  N/A  Assets:  Communication Skills Desire for Improvement Financial Resources/Insurance Housing Leisure Time Physical Health Social Support Talents/Skills Transportation Vocational/Educational  ADL's:  Intact  Cognition: WNL  Sleep:  Fair     Assessment: This is a 22 year old Hispanic American, 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   She endorses long hx of symptoms of depression and anxiety with periodic worsening in the context of acute and chronic psychosocial stressors. Recently discovered co-worker she asked for date started dating other female co-worker which likely resulted in thoughts of poor self worth, not being good enough cognitive distortion which are chronic and most likely has resulted in worsening of depression, anxiety and SI. She denies any current suicidal thoughts, intent or plan and reports that she has not acted on these thoughts previously except cutting self once. Med trials: Taking Lexapro 20 mg since 2014, last adjustment of dose was in 2014.   Treatment Plan Summary: Discussed indications supporting diagnoses of MDD, Anxiety Disorders.  Recommended increasing Lexapro to 25 mg daily. Discussed 25 mg daily being an off label dose.  Discussed potential benefit, side effects, directions for administration, contact with questions/concerns. Also discussed option for adding Wellbutrin however pt and parent chose to increase Lexapro first before trialing additional medication. Discussed importance of individual therapy and recommended atleast once a week. Pt would like to establish therapy outside of college and recommended to make ind therapy with Ms. Tasia Catchings in the clinic. Pt verbalized understanding. Discussed recommendation for inpatient hospitalization however pt does not feel she need to be hospitalized and reports that she is feeling safe and does not have any current SI or intent or plan to harm self. Pt does not appear in an imminent danger to self or others  and she does not fit criteria for involuntary admission. She verbalized understanding and agreement to report to nearst ER, call 911 or talk to mother for any immediate safety concerns. Pt does not have access to guns. Discussed with mother to frequently check in with patient and recommended to call 911 or go to ER for any immediate safety concern. Recommended to return next week however pt reported that she will be meeting with therapist at the college tomorrow and made an appointment with therapist in  this clinic the week after. Return 2 weeks. 60 mins with patient with greater than 50% counseling as above    Darcel Smalling, MD 10/3/20195:54 PM

## 2018-01-16 NOTE — Progress Notes (Signed)
Sheri Kline is a 22 y.o. female in treatment for Depression and anxiety and displays the following risk factors for Suicide:  Demographic factors:  Adolescent or young adult Current Mental Status: No plan to harm self or others, Has intermittent SI and self harm thoughts as mentioned in H&P from today Loss Factors: None Historical Factors: None Risk Reduction Factors: Sense of responsibility to family, Employed, Living with another person, especially a relative, Positive social support and Positive coping skills or problem solving skills  CLINICAL FACTORS:  Severe Anxiety and/or Agitation Panic Attacks Depression:   Anhedonia Insomnia Severe  COGNITIVE FEATURES THAT CONTRIBUTE TO RISK: Cognitive Distortions for self worth  SUICIDE RISK:  Moderate:  Frequent suicidal ideation with limited intensity, and duration, some specificity in terms of plans, no associated intent, good self-control, limited dysphoria/symptomatology, some risk factors present, and identifiable protective factors, including available and accessible social support.  Mental Status: As mentioned in H&P from today's visit.    PLAN OF CARE:  Sheri Kline currently denies any SI/HI and does not appear in imminent danger to self/others. Her hx of depression, anxiety, Hx of self harm thoughts/SI and poor self esteem puts her at a chronically elevated risk of self harm. She is future oriented, appears intelligent, has long term goals for herself, does have good support from family and friends, and appears to have financial stability and these will likely serve as protective factors for her. She is recommended to follow up with this clinic for medications, and ind therapy which would likely help reduce chronic risk of self harm/suicide.    Darcel Smalling, MD 01/16/2018, 5:46 PM

## 2018-01-17 ENCOUNTER — Encounter: Payer: Self-pay | Admitting: Child and Adolescent Psychiatry

## 2018-02-11 ENCOUNTER — Encounter: Payer: Self-pay | Admitting: Child and Adolescent Psychiatry

## 2018-02-11 ENCOUNTER — Ambulatory Visit (INDEPENDENT_AMBULATORY_CARE_PROVIDER_SITE_OTHER): Admitting: Child and Adolescent Psychiatry

## 2018-02-11 ENCOUNTER — Other Ambulatory Visit: Payer: Self-pay

## 2018-02-11 DIAGNOSIS — F332 Major depressive disorder, recurrent severe without psychotic features: Secondary | ICD-10-CM | POA: Diagnosis not present

## 2018-02-11 DIAGNOSIS — F418 Other specified anxiety disorders: Secondary | ICD-10-CM

## 2018-02-11 MED ORDER — HYDROXYZINE HCL 25 MG PO TABS: 25.0000 mg | ORAL_TABLET | Freq: Three times a day (TID) | ORAL | 0 refills | Status: DC | PRN

## 2018-02-11 MED ORDER — ESCITALOPRAM OXALATE 5 MG PO TABS: 5.0000 mg | ORAL_TABLET | Freq: Every day | ORAL | 0 refills | Status: DC

## 2018-02-11 MED ORDER — ESCITALOPRAM OXALATE 20 MG PO TABS: 20.0000 mg | ORAL_TABLET | Freq: Every day | ORAL | 0 refills | Status: DC

## 2018-02-11 NOTE — Progress Notes (Signed)
BH MD/PA/NP OP Progress Note  02/11/2018 5:18 PM Sheri Kline  MRN:  [id number]  Chief Complaint: Medication management follow up visit for depression and anxiety. Chief Complaint    Follow-up; Medication Refill     HPI: Patient presented on time for her scheduled appointment and was accompanied with her mother.  She was seen and evaluated alone and together with the mother.  Patient provided informed consent to her mother in the office during the evaluation.  Mild denies any new concerns for today's visit.  No acute events in the interim reported by patient or parent.  Myotte had started seeing school counselor at [facility] in the interim since the last visit. Adiel reports that she has been doing significantly better as compared to the last visit.  She reports that she has been having less depressive thoughts and states "everything is going smoothly".  She reports that 1 week after her last visit she started noticing improvement in her mood and anhedonia.  She also reports that she has not been having any suicidal thoughts since the last 3-4 weeks.  She attributes improvement in her mood to starting to see counselor; starting kickboxing; improved self-esteem.  She rates her mood at 4 out of 10(10 = most depressed) as compared to 7 or 8 out of 10 during the last visit.  She reports that her anxiety has not changed significantly and rates her anxiety at 6 out of 10(10 = most anxious).  She reports eating better and sleep has improved.  She reported that she has tolerated increased Lexapro well and denies any side effects.  Mother also reports that she had seen significant improvement since the last visit.  Mother reports that she appears "much happier" is compared to last visit.  Mother denies any new concerns for patient.   Visit Diagnosis:    ICD-10-CM   1. Other specified anxiety disorders F41.8 hydrOXYzine (ATARAX/VISTARIL) 25 MG tablet    escitalopram (LEXAPRO) 20 MG tablet     escitalopram (LEXAPRO) 5 MG tablet  2. Severe episode of recurrent major depressive disorder, without psychotic features (HCC) F33.2 escitalopram (LEXAPRO) 20 MG tablet    escitalopram (LEXAPRO) 5 MG tablet    Past Psychiatric History: As mentioned in initial H&P except that pt started seeing Mr. Marthann Schiller at [facility] [phone].   Past Medical History:  Past Medical History:  Diagnosis Date  . Acne vulgaris 09/30/2014  . Adjustment disorder with mixed anxiety and depressed mood 12/17/2012  . Adopted 05/11/2013  . Anxiety   . Hx of psychiatric hospitalization 11/2012  . Irregular menstrual cycle 11/11/2015  . Mild Environmental and seasonal allergies 11/11/2015  . Sleep disturbance 02/17/2013    Past Surgical History:  Procedure Laterality Date  . ESOPHAGOGASTRODUODENOSCOPY ENDOSCOPY      Family Psychiatric History: As mentioned in initial H&P  Family History:  Family History  Adopted: Yes  Problem Relation Age of Onset  . Alcohol abuse Father     Social History:  Social History   Socioeconomic History  . Marital status: Single    Spouse name: Not on file  . Number of children: 0  . Years of education: Not on file  . Highest education level: Associate degree: occupational, Scientist, product/process development, or vocational program  Occupational History  . Not on file  Social Needs  . Financial resource strain: Not hard at all  . Food insecurity:    Worry: Never true    Inability: Never true  . Transportation needs:  Medical: No    Non-medical: No  Tobacco Use  . Smoking status: Never Smoker  . Smokeless tobacco: Never Used  Substance and Sexual Activity  . Alcohol use: No  . Drug use: No  . Sexual activity: Never    Birth control/protection: None  Lifestyle  . Physical activity:    Days per week: 4 days    Minutes per session: 60 min  . Stress: To some extent  Relationships  . Social connections:    Talks on phone: Not on file    Gets together: Not on file     Attends religious service: 1 to 4 times per year    Active member of club or organization: No    Attends meetings of clubs or organizations: Never    Relationship status: Never married  Other Topics Concern  . Not on file  Social History Narrative  . Not on file    Allergies:  Allergies  Allergen Reactions  . Amoxicillin Rash    Metabolic Disorder Labs: Lab Results  Component Value Date   HGBA1C 5.1 11/18/2014   MPG 100 11/18/2014   Lab Results  Component Value Date   PROLACTIN 17.6 11/18/2014   Lab Results  Component Value Date   CHOL 112 09/30/2014   TRIG 126 09/30/2014   HDL 37 09/30/2014   CHOLHDL 3.0 09/30/2014   VLDL 25 09/30/2014   LDLCALC 50 09/30/2014   Lab Results  Component Value Date   TSH 1.135 11/18/2014    Therapeutic Level Labs: No results found for: LITHIUM No results found for: VALPROATE No components found for:  CBMZ  Current Medications: Current Outpatient Medications  Medication Sig Dispense Refill  . escitalopram (LEXAPRO) 20 MG tablet Take 1 tablet (20 mg total) by mouth daily. 30 tablet 0  . escitalopram (LEXAPRO) 5 MG tablet Take 1 tablet (5 mg total) by mouth daily. Take along with Lexapro 20 mg daily to make total daily dose of 25 mg once a day. 30 tablet 0  . fexofenadine-pseudoephedrine (ALLEGRA-D ALLERGY & CONGESTION) 180-240 MG 24 hr tablet Take 1 tablet by mouth daily. 90 tablet 4  . fluticasone (FLONASE) 50 MCG/ACT nasal spray 1 spray into each nostril twice daily ( following sinus rinses) 16 g 11  . Melatonin 5 MG TABS Take 10 mg by mouth at bedtime.    . hydrOXYzine (ATARAX/VISTARIL) 25 MG tablet Take 1 tablet (25 mg total) by mouth every 8 (eight) hours as needed for anxiety. 30 tablet 0   No current facility-administered medications for this visit.      Musculoskeletal:  Gait & Station: normal Patient leans: N/A  Psychiatric Specialty Exam: ROS  Blood pressure 101/64, pulse 81, temperature 97.9 F (36.6 C),  temperature source Oral, weight 159 lb (72.1 kg), last menstrual period 01/28/2018.Body mass index is 26.46 kg/m.  General Appearance: Casual and Well Groomed  Eye Contact:  Good  Speech:  Clear and Coherent and Normal Rate  Volume:  Normal  Mood:  "good"  Affect:  Appropriate, Congruent and Constricted  Thought Process:  Goal Directed and Linear  Orientation:  Full (Time, Place, and Person)  Thought Content: Logical   Suicidal Thoughts:  No  Homicidal Thoughts:  No  Memory:  Immediate;   Good Recent;   Good Remote;   Good  Judgement:  Good  Insight:  Good  Psychomotor Activity:  Normal  Concentration:  Concentration: Good and Attention Span: Good  Recall:  Good  Fund of Knowledge: Good  Language: Good  Akathisia:  No    AIMS (if indicated): not done  Assets:  Communication Skills Desire for Improvement Financial Resources/Insurance Housing Leisure Time Physical Health Social Support Talents/Skills Transportation Vocational/Educational  ADL's:  Intact  Cognition: WNL  Sleep:  Good   Screenings: GAD-7     Office Visit from 01/08/2018 in [facility] at [facility] Office Visit from 09/16/2017 in [facility] Visit from 08/24/2016 in [facility] Visit from 02/15/2016 in [facility] Primary Care at [facility]  Total GAD-7 Score  10  4  7  3     PHQ2-9     Office Visit from 01/08/2018 in [facility] at [facility] Visit from 09/16/2017 in [facility] Primary Care at [facility] Visit from 08/24/2016 in [facility] Care at [facility] Visit from 02/15/2016 in [facility] at [facility] Visit from 11/10/2015 in 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   She endorses long hx of symptoms of depression and anxiety with periodic worsening in the context of acute and chronic psychosocial stressors. Recently discovered that co-worker she asked for date started dating other female co-worker which likely resulted in thoughts of poor self worth, not being good enough, cognitive distortion which are chronic and most likely has resulted in worsening of depression, anxiety and SI. She denies any current suicidal thoughts, intent or plan and reports that she has not acted on these thoughts previously except cutting self once. Med trials: Taking Lexapro 20 mg since 2014, last adjustment of dose was in 2014.   Treatment Plan Summary: Discussed indications supporting diagnoses of MDD, Anxiety Disorders. Recommended continuing Lexapro to 25 mg daily. Discussed 25 mg daily being an off label dose. Discussed potential benefit, side effects, directions for administration, contact with questions/concerns. Discussed to add Hydroxyzine 25 mg Q8hrs PRN for anxiety. Discussed risk vs benefits. Pt verbalized understanding.  Discussed importance of individual therapy and recommended atleast once a week. Pt would like to continue therapy at [facility] with Dr. Leta Speller @ [phone]. Recommended to sign ROI to collaborate the care. Pt verbalized understanding.Discussed the recommendation to go to nearst ER, call 911 or talk to mother for any immediate safety concerns. Pt does not have access to guns. Recommended to return in 3 weeks. 30 mins with patient with greater than 50% counseling as above     Darcel Smalling, MD 02/11/2018, 5:18 PM

## 2018-03-04 ENCOUNTER — Ambulatory Visit: Admitting: Child and Adolescent Psychiatry

## 2018-03-10 ENCOUNTER — Ambulatory Visit (INDEPENDENT_AMBULATORY_CARE_PROVIDER_SITE_OTHER): Admitting: Child and Adolescent Psychiatry

## 2018-03-10 ENCOUNTER — Other Ambulatory Visit: Payer: Self-pay

## 2018-03-10 ENCOUNTER — Encounter: Payer: Self-pay | Admitting: Child and Adolescent Psychiatry

## 2018-03-10 VITALS — BP 99/64 | HR 76 | Temp 98.6°F | Wt 159.0 lb

## 2018-03-10 DIAGNOSIS — F3341 Major depressive disorder, recurrent, in partial remission: Secondary | ICD-10-CM | POA: Diagnosis not present

## 2018-03-10 DIAGNOSIS — F418 Other specified anxiety disorders: Secondary | ICD-10-CM | POA: Diagnosis not present

## 2018-03-10 MED ORDER — ESCITALOPRAM OXALATE 5 MG PO TABS: 5.0000 mg | ORAL_TABLET | Freq: Every day | ORAL | 1 refills | Status: DC

## 2018-03-10 NOTE — Progress Notes (Signed)
BH MD/PA/NP OP Progress Note  03/10/2018 3:14 PM Sheri Kline  MRN:  [id number]  Chief Complaint: Medication management follow up visit for depression and anxiety. Chief Complaint    Follow-up; Medication Refill     HPI: Mild presented on time for her scheduled appointment by herself.  She was seen and evaluated alone in the office today.  Mild denies any new concerns for today's visit.  She reports that her mood has been "pretty good" , and rates her mood at 2 oh 3 out of 10 (10 = most depressed), reports that she continues to work at [employer], stay physically active by going to kickboxing and doing well at the school overall.  She reported that she presented about her trip to [place] to her class which she has had "went better than I thought".  She reports that since the holidays are coming there has been more workload at the school which has been slightly stressing her out however reports that her anxiety has been manageable.  She rates her anxiety at 6 out of 10 (10 = most anxious ).  She denies any suicidal thoughts or self-harm thoughts recently( at least past two months).  She reports that she continues to see her therapist at [facility] on weekly basis and has been focusing on her social anxiety and other interventions for her anxiety.  She reports that she has been taking her medications on a regular basis and denies any side effects from it.  She reports that she had tried hydroxyzine 25 mg once however it made her sleepy.  She denies using it again.  She denies any panic attacks since the last visit.   Visit Diagnosis:    ICD-10-CM   1. Other specified anxiety disorders F41.8 escitalopram (LEXAPRO) 5 MG tablet  2. Recurrent major depressive disorder, in partial remission (HCC) F33.41 escitalopram (LEXAPRO) 5 MG tablet    Past Psychiatric History: As mentioned in initial H&P, continuing to see Mr. Marthann Schiller at [facility] [phone].   Past Medical History:  Past  Medical History:  Diagnosis Date  . Acne vulgaris 09/30/2014  . Adjustment disorder with mixed anxiety and depressed mood 12/17/2012  . Adopted 05/11/2013  . Anxiety   . Hx of psychiatric hospitalization 11/2012  . Irregular menstrual cycle 11/11/2015  . Mild Environmental and seasonal allergies 11/11/2015  . Sleep disturbance 02/17/2013    Past Surgical History:  Procedure Laterality Date  . ESOPHAGOGASTRODUODENOSCOPY ENDOSCOPY      Family Psychiatric History: As mentioned in initial H&P  Family History:  Family History  Adopted: Yes  Problem Relation Age of Onset  . Alcohol abuse Father     Social History:  Social History   Socioeconomic History  . Marital status: Single    Spouse name: Not on file  . Number of children: 0  . Years of education: Not on file  . Highest education level: Associate degree: occupational, Scientist, product/process development, or vocational program  Occupational History  . Not on file  Social Needs  . Financial resource strain: Not hard at all  . Food insecurity:    Worry: Never true    Inability: Never true  . Transportation needs:    Medical: No    Non-medical: No  Tobacco Use  . Smoking status: Never Smoker  . Smokeless tobacco: Never Used  Substance and Sexual Activity  . Alcohol use: No  . Drug use: No  . Sexual activity: Never    Birth control/protection: None  Lifestyle  . Physical activity:    Days per week: 4 days    Minutes per session: 60 min  . Stress: To some extent  Relationships  . Social connections:    Talks on phone: Not on file    Gets together: Not on file    Attends religious service: 1 to 4 times per year    Active member of club or organization: No    Attends meetings of clubs or organizations: Never    Relationship status: Never married  Other Topics Concern  . Not on file  Social History Narrative  . Not on file    Allergies:  Allergies  Allergen Reactions  . Amoxicillin Rash    Metabolic Disorder Labs: Lab Results   Component Value Date   HGBA1C 5.1 11/18/2014   MPG 100 11/18/2014   Lab Results  Component Value Date   PROLACTIN 17.6 11/18/2014   Lab Results  Component Value Date   CHOL 112 09/30/2014   TRIG 126 09/30/2014   HDL 37 09/30/2014   CHOLHDL 3.0 09/30/2014   VLDL 25 09/30/2014   LDLCALC 50 09/30/2014   Lab Results  Component Value Date   TSH 1.135 11/18/2014    Therapeutic Level Labs: No results found for: LITHIUM No results found for: VALPROATE No components found for:  CBMZ  Current Medications: Current Outpatient Medications  Medication Sig Dispense Refill  . escitalopram (LEXAPRO) 20 MG tablet Take 1 tablet (20 mg total) by mouth daily. 30 tablet 0  . escitalopram (LEXAPRO) 5 MG tablet Take 1 tablet (5 mg total) by mouth daily. Take along with Lexapro 20 mg daily to make total daily dose of 25 mg once a day. 30 tablet 1  . fexofenadine-pseudoephedrine (ALLEGRA-D ALLERGY & CONGESTION) 180-240 MG 24 hr tablet Take 1 tablet by mouth daily. 90 tablet 4  . fluticasone (FLONASE) 50 MCG/ACT nasal spray 1 spray into each nostril twice daily ( following sinus rinses) 16 g 11  . hydrOXYzine (ATARAX/VISTARIL) 25 MG tablet Take 1 tablet (25 mg total) by mouth every 8 (eight) hours as needed for anxiety. 30 tablet 0  . Melatonin 5 MG TABS Take 10 mg by mouth at bedtime.     No current facility-administered medications for this visit.      Musculoskeletal:  Gait & Station: normal Patient leans: N/A  Psychiatric Specialty Exam: ROS  Blood pressure 99/64, pulse 76, temperature 98.6 F (37 C), temperature source Oral, weight 159 lb (72.1 kg).Body mass index is 26.46 kg/m.  General Appearance: Casual and Well Groomed  Eye Contact:  Good  Speech:  Clear and Coherent and Normal Rate  Volume:  Normal  Mood:  "pretty good"  Affect:  Appropriate, Congruent and Constricted  Thought Process:  Goal Directed and Linear  Orientation:  Full (Time, Place, and Person)  Thought  Content: Logical   Suicidal Thoughts:  No  Homicidal Thoughts:  No  Memory:  Immediate;   Good Recent;   Good Remote;   Good  Judgement:  Good  Insight:  Good  Psychomotor Activity:  Normal  Concentration:  Concentration: Good and Attention Span: Good  Recall:  Good  Fund of Knowledge: Good  Language: Good  Akathisia:  No    AIMS (if indicated): not done  Assets:  Communication Skills Desire for Improvement Financial Resources/Insurance Housing Leisure Time Physical Health Social Support Talents/Skills Transportation Vocational/Educational  ADL's:  Intact  Cognition: WNL  Sleep:  Good   Screenings: GAD-7  [facility]  Total GAD-7 Score  10  4  7  3     PHQ2-9     [facility] from [facility]2017 [facility]  PHQ-2 Total Score  4  2  1  1  2   PHQ-9 Total Score  15  7  5  3  4        Assessment and Plan: This is a 22 year old Hispanic American, adopted female, 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   She endorses long hx of symptoms of depression and  anxiety with periodic worsening in the context of acute and chronic psychosocial stressors. Recently discovered that co-worker she asked for date started dating other female co-worker which likely resulted in thoughts of poor self worth, not being good enough, cognitive distortion which are chronic and appeared to have resulted in worsening of depression, anxiety and SI. She denies any current suicidal thoughts, intent or plan and reports that she has not acted on these thoughts previously except cutting self once. Pt reports improvement in mood and anxiety symptoms. Med trials: Taking Lexapro 20 mg since 2014, last adjustment of dose was in 2014.   Treatment Plan Summary: Discussed indications supporting diagnoses of MDD, Anxiety Disorders.   Problem 1 : Anxiety, Partially improving - Recommended continuing Lexapro to 25 mg daily. Discussed 25 mg daily being an off label dose. Discussed potential benefit, side effects, directions for administration, contact with questions/concerns. - Discussed using Hydroxyzine 12.5 mg to 25 mg Q8hrs PRN for anxiety. Discussed risk vs benefits. Pt verbalized understanding.   - Discussed importance of individual therapy and recommended atleast once a week. [facility]. Leta Spellerickard @ [phone].  - Pt was previously recommended to sign ROI to collaborate the care. Pt verbalized understanding and also provided verbal consent to speak with therapist if needed.  - Previously discussed the recommendation to go to nearst ER, call 911 or talk to mother for any immediate safety concerns. Pt does not have access to guns.   Problem 2: Depression; in partial remission - As mentioned above.    - Pt now would like to follow up in 6 weeks given she is doing better. Recommended to return in 6 weeks or early if needed. 30 mins with patient with greater than 50% counseling as above    Darcel SmallingHiren M Wymon Swaney,  MD 03/10/2018, 3:14 PM

## 2018-04-07 ENCOUNTER — Ambulatory Visit: Admitting: Psychiatry

## 2018-04-17 ENCOUNTER — Ambulatory Visit (INDEPENDENT_AMBULATORY_CARE_PROVIDER_SITE_OTHER): Admitting: Child and Adolescent Psychiatry

## 2018-04-17 ENCOUNTER — Other Ambulatory Visit: Payer: Self-pay

## 2018-04-17 ENCOUNTER — Encounter: Payer: Self-pay | Admitting: Child and Adolescent Psychiatry

## 2018-04-17 VITALS — BP 100/65 | HR 62 | Temp 97.7°F | Wt 159.4 lb

## 2018-04-17 DIAGNOSIS — F418 Other specified anxiety disorders: Secondary | ICD-10-CM

## 2018-04-17 DIAGNOSIS — F3341 Major depressive disorder, recurrent, in partial remission: Secondary | ICD-10-CM | POA: Insufficient documentation

## 2018-04-17 MED ORDER — ESCITALOPRAM OXALATE 20 MG PO TABS: 20.0000 mg | ORAL_TABLET | Freq: Every day | ORAL | 1 refills | Status: DC

## 2018-04-17 MED ORDER — ESCITALOPRAM OXALATE 5 MG PO TABS: 5.0000 mg | ORAL_TABLET | Freq: Every day | ORAL | 1 refills | Status: DC

## 2018-04-17 NOTE — Progress Notes (Signed)
BH MD/PA/NP OP Progress Note  04/17/2018 4:27 PM Sheri Kline  MRN:  [id number]  Chief Complaint: Medication management follow up visit for depression and anxiety. Chief Complaint    Follow-up; Medication Refill     HPI: Sheri Kline presented on time for her scheduled appointment for medication management follow-up for depression and anxiety.  Sheri Kline appeared calm, cooperative, well related, pleasant during the evaluation.  She denies any acute medical events in the interim since the last visit.  She reports that she has been on break until 14th and has been living with her parents who have moved to a different house.  She reported that she had good holidays, celebrated Christmas with her parents and extended family, has continued to work at [employer], and denies any new psychosocial stressors except some difficulties between her parents and her brother.  Sheri Kline denies any new concerns for today's visit, denies being depressed, reports her mood has "good", denies anxiety, reports eating and sleeping well, denies problems with energy, denies any thoughts of suicide, denies thoughts of violence.  She reports that she continues to see her therapist Dr. Leta [facility]ickard at [employer] [employer] every week whenever she is in college and will be resuming it once the college starts, continues to kick box.  She reports that she is completely adherent to medications and denies any side effects with it.  She reports that combination of therapy, exercise and medication has been helpful in stabilizing her mood and anxiety.  She would like to stay on the same dosage for now.  Visit Diagnosis:    ICD-10-CM   1. Other specified anxiety disorders F41.8 escitalopram (LEXAPRO) 20 MG tablet    escitalopram (LEXAPRO) 5 MG tablet  2. Recurrent major depressive disorder, in partial remission (HCC) F33.41 escitalopram (LEXAPRO) 5 MG tablet    Past Psychiatric History: As mentioned in initial H&P, continuing to see Mr. Marthann SchillerKaellin  Rickard at [employer] [phone].   Past Medical History:  Past Medical History:  Diagnosis Date  . Acne vulgaris 09/30/2014  . Adjustment disorder with mixed anxiety and depressed mood 12/17/2012  . Adopted 05/11/2013  . Anxiety   . Hx of psychiatric hospitalization 11/2012  . Irregular menstrual cycle 11/11/2015  . Mild Environmental and seasonal allergies 11/11/2015  . Sleep disturbance 02/17/2013    Past Surgical History:  Procedure Laterality Date  . ESOPHAGOGASTRODUODENOSCOPY ENDOSCOPY      Family Psychiatric History: As mentioned in initial H&P  Family History:  Family History  Adopted: Yes  Problem Relation Age of Onset  . Alcohol abuse Father     Social History:  Social History   Socioeconomic History  . Marital status: Single    Spouse name: Not on file  . Number of children: 0  . Years of education: Not on file  . Highest education level: Associate degree: occupational, Scientist, product/process developmenttechnical, or vocational program  Occupational History  . Not on file  Social Needs  . Financial resource strain: Not hard at all  . Food insecurity:    Worry: Never true    Inability: Never true  . Transportation needs:    Medical: No    Non-medical: No  Tobacco Use  . Smoking status: Never Smoker  . Smokeless tobacco: Never Used  Substance and Sexual Activity  . Alcohol use: No  . Drug use: No  . Sexual activity: Never    Birth control/protection: None  Lifestyle  . Physical activity:    Days per week: 4 days  Minutes per session: 60 min  . Stress: To some extent  Relationships  . Social connections:    Talks on phone: Not on file    Gets together: Not on file    Attends religious service: 1 to 4 times per year    Active member of club or organization: No    Attends meetings of clubs or organizations: Never    Relationship status: Never married  Other Topics Concern  . Not on file  Social History Narrative  . Not on file    Allergies:  Allergies  Allergen  Reactions  . Amoxicillin Rash    Metabolic Disorder Labs: Lab Results  Component Value Date   HGBA1C 5.1 11/18/2014   MPG 100 11/18/2014   Lab Results  Component Value Date   PROLACTIN 17.6 11/18/2014   Lab Results  Component Value Date   CHOL 112 09/30/2014   TRIG 126 09/30/2014   HDL 37 09/30/2014   CHOLHDL 3.0 09/30/2014   VLDL 25 09/30/2014   LDLCALC 50 09/30/2014   Lab Results  Component Value Date   TSH 1.135 11/18/2014    Therapeutic Level Labs: No results found for: LITHIUM No results found for: VALPROATE No components found for:  CBMZ  Current Medications: Current Outpatient Medications  Medication Sig Dispense Refill  . escitalopram (LEXAPRO) 20 MG tablet Take 1 tablet (20 mg total) by mouth daily. 30 tablet 1  . escitalopram (LEXAPRO) 5 MG tablet Take 1 tablet (5 mg total) by mouth daily. Take along with Lexapro 20 mg daily to make total daily dose of 25 mg once a day. 30 tablet 1  . fexofenadine-pseudoephedrine (ALLEGRA-D ALLERGY & CONGESTION) 180-240 MG 24 hr tablet Take 1 tablet by mouth daily. 90 tablet 4  . fluticasone (FLONASE) 50 MCG/ACT nasal spray 1 spray into each nostril twice daily ( following sinus rinses) 16 g 11  . hydrOXYzine (ATARAX/VISTARIL) 25 MG tablet Take 1 tablet (25 mg total) by mouth every 8 (eight) hours as needed for anxiety. 30 tablet 0  . Melatonin 5 MG TABS Take 10 mg by mouth at bedtime.     No current facility-administered medications for this visit.      Musculoskeletal:  Gait & Station: normal Patient leans: N/A  Psychiatric Specialty Exam: Review of Systems  Constitutional: Negative for fever.  Neurological: Negative for seizures.  Psychiatric/Behavioral: Negative for depression, hallucinations, substance abuse and suicidal ideas. The patient is not nervous/anxious and does not have insomnia.     Blood pressure 100/65, pulse 62, temperature 97.7 F (36.5 C), temperature source Oral, weight 159 lb 6.4 oz (72.3  kg), last menstrual period 04/10/2018.Body mass index is 26.53 kg/m.  General Appearance: Casual and Well Groomed  Eye Contact:  Good  Speech:  Clear and Coherent and Normal Rate  Volume:  Normal  Mood:  "good"  Affect:  Appropriate, Congruent and Constricted  Thought Process:  Goal Directed and Linear  Orientation:  Full (Time, Place, and Person)  Thought Content: Logical   Suicidal Thoughts:  No  Homicidal Thoughts:  No  Memory:  Immediate;   Good Recent;   Good Remote;   Good  Judgement:  Good  Insight:  Good  Psychomotor Activity:  Normal  Concentration:  Concentration: Good and Attention Span: Good  Recall:  Good  Fund of Knowledge: Good  Language: Good  Akathisia:  No    AIMS (if indicated): not done  Assets:  Communication Skills Desire for Improvement Financial Resources/Insurance Housing Leisure Time  Physical Health Social Support Talents/Skills Transportation Vocational/Educational  ADL's:  Intact  Cognition: WNL  Sleep:  Good   Screenings: GAD-7     Office Visit from 01/08/2018 in [facility] at [facility] Visit from 09/16/2017 in [facility] Care at [facility] Visit from 08/24/2016 in [facility] Care at [facility] Visit from 02/15/2016 in [facility] Primary Care at [facility]  Total GAD-7 Score  10  4  7  3     PHQ2-9     Office Visit from 01/08/2018 in [facility] at [facility] Visit from 09/16/2017 in [facility] Primary Care at [facility] Visit from 08/24/2016 in [facility] Care at [facility] Visit from 02/15/2016 in [facility] Visit from 11/10/2015 in [facility] Primary Care at [facility] Total Score  4  2  1  1  2   PHQ-9 Total Score  15  7  5  3  4        Assessment and Plan: This is a 23 year old Hispanic American, 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   She endorsed long hx of symptoms of depression and anxiety with periodic worsening in the context of acute and chronic psychosocial stressors. Recently discovered that co-worker she asked for date started dating other female co-worker which likely resulted in thoughts of poor self worth, not being good enough, cognitive distortion which are chronic and appeared to have resulted in worsening of depression, anxiety and SI on the initial intake in 01/16/2018.  She denies any current suicidal thoughts, intent or plan and reports that she has not acted on these thoughts previously except cutting self once. Pt reports improvement in mood and anxiety symptoms since increased lexapro. Med trials: Taking Lexapro 20 mg since 2014, last adjustment of dose was in 2014.   Treatment Plan Summary: Discussed indications supporting diagnoses of MDD, Anxiety Disorders.   Problem 1 : Anxiety, Partially improving - Recommended continuing Lexapro to 25 mg daily. Discussed 25 mg daily being an off label dose. Discussed potential benefit, side effects, directions for administration, contact with questions/concerns. - Discussed using Hydroxyzine 12.5 mg to 25 mg Q8hrs PRN for anxiety. Discussed risk vs benefits. Pt verbalized understanding.  Used only once since the last visit.  - Discussed importance of individual therapy and recommended atleast once a week. Pt would like to continue therapy at [facility] with Dr. Leta Speller @ [phone].  - Pt was previously recommended to sign ROI to collaborate the care. Pt verbalized understanding and also provided verbal consent to speak with therapist  if needed.  - Previously discussed the recommendation to go to nearst ER, call 911 or talk to mother for any immediate safety concerns. Pt does not have access to guns.   Problem 2: Depression; in partial remission - As mentioned above.    -  Recommended to return in 6 weeks or early if needed. 25 mins with patient with greater than 50% counseling as above    Darcel Smalling, MD 04/17/2018, 4:27 PM

## 2018-06-05 ENCOUNTER — Ambulatory Visit: Admitting: Child and Adolescent Psychiatry

## 2018-06-26 ENCOUNTER — Encounter: Payer: Self-pay | Admitting: Child and Adolescent Psychiatry

## 2018-06-26 ENCOUNTER — Other Ambulatory Visit: Payer: Self-pay

## 2018-06-26 ENCOUNTER — Ambulatory Visit (INDEPENDENT_AMBULATORY_CARE_PROVIDER_SITE_OTHER): Admitting: Child and Adolescent Psychiatry

## 2018-06-26 VITALS — BP 103/67 | HR 76 | Temp 99.7°F | Wt 162.0 lb

## 2018-06-26 DIAGNOSIS — F3341 Major depressive disorder, recurrent, in partial remission: Secondary | ICD-10-CM | POA: Diagnosis not present

## 2018-06-26 DIAGNOSIS — F418 Other specified anxiety disorders: Secondary | ICD-10-CM | POA: Diagnosis not present

## 2018-06-26 MED ORDER — ESCITALOPRAM OXALATE 5 MG PO TABS: 5.0000 mg | ORAL_TABLET | Freq: Every day | ORAL | 1 refills | Status: DC

## 2018-06-26 MED ORDER — ESCITALOPRAM OXALATE 20 MG PO TABS: 20.0000 mg | ORAL_TABLET | Freq: Every day | ORAL | 1 refills | Status: DC
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
## 2018-07-18 ENCOUNTER — Other Ambulatory Visit: Payer: Self-pay | Admitting: Family Medicine

## 2018-08-26 ENCOUNTER — Other Ambulatory Visit: Payer: Self-pay

## 2018-08-26 ENCOUNTER — Ambulatory Visit (INDEPENDENT_AMBULATORY_CARE_PROVIDER_SITE_OTHER): Admitting: Child and Adolescent Psychiatry

## 2018-08-26 ENCOUNTER — Encounter: Payer: Self-pay | Admitting: Child and Adolescent Psychiatry

## 2018-08-26 DIAGNOSIS — F3341 Major depressive disorder, recurrent, in partial remission: Secondary | ICD-10-CM | POA: Diagnosis not present

## 2018-08-26 DIAGNOSIS — F418 Other specified anxiety disorders: Secondary | ICD-10-CM

## 2018-08-26 MED ORDER — ESCITALOPRAM OXALATE 5 MG PO TABS: 5.0000 mg | ORAL_TABLET | Freq: Every day | ORAL | 2 refills | Status: DC

## 2018-08-26 MED ORDER — ESCITALOPRAM OXALATE 20 MG PO TABS: 20.0000 mg | ORAL_TABLET | Freq: Every day | ORAL | 2 refills | Status: DC

## 2018-08-26 NOTE — Progress Notes (Signed)
Virtual Visit via Video Note  I connected with Sheri Kline on 08/26/18 at  2:00 PM EDT by a video enabled telemedicine application and verified that I am speaking with the correct person using two identifiers.  Location: Patient: Set designer Provider: Office   I discussed the limitations of evaluation and management by telemedicine and the availability of in person appointments. The patient expressed understanding and agreed to proceed.    BH MD/PA/NP OP Progress Note  08/26/2018 2:00 PM Sheri Kline  MRN:  [id number]  Chief Complaint: Medication management follow-up for depression and anxiety.  Marland Kitchen  HPI: Sheri Kline was seen and evaluated over telemedicine encounter for routine medication management follow-up for depression and anxiety.  Sheri Kline appeared calm, cooperative and well related with bright and broad range of affect.  She denies any new concerns for today's visit and reports that she has continued to do well despite all the changes related to COVID-19.  She reports that she was transitioned to online school and has done well with the schoolwork.  She reports that she has couple more assignments and then she will be done with her semester.  She reports that she has been working at her old job which is at the [employer] and will be starting to work at his [employer] from next week.  She reports that her mood has been "stable", denies feeling depressed, denies irritability, denies anhedonia, reports eating and sleeping well.  She denies any thoughts of suicide or self-harm.  No HI. She reports that since everything is closed and she is not exposed to a lot of social situation her anxiety has "leveled out".  She rates her anxiety at 4/10 (10 = most anxious) and depression at 3/10(10 = most depressed).  She reports that she has been adherent to her Lexapro and has not needed to take hydroxyzine for anxiety or sleep.  She denies any new stressors.  She reports that she has not seen her therapist  recently because she has been doing well.  She also would like to switch appointment out further than every 2 months.   Visit Diagnosis:    ICD-10-CM   1. Other specified anxiety disorders F41.8 escitalopram (LEXAPRO) 20 MG tablet    escitalopram (LEXAPRO) 5 MG tablet  2. Recurrent major depressive disorder, in partial remission (HCC) F33.41 escitalopram (LEXAPRO) 5 MG tablet    Past Psychiatric History: As mentioned in initial H&P, continuing to see Mr. Marthann Schiller at [facility] [phone].   Past Medical History:  Past Medical History:  Diagnosis Date  . Acne vulgaris 09/30/2014  . Adjustment disorder with mixed anxiety and depressed mood 12/17/2012  . Adopted 05/11/2013  . Anxiety   . Hx of psychiatric hospitalization 11/2012  . Irregular menstrual cycle 11/11/2015  . Mild Environmental and seasonal allergies 11/11/2015  . Sleep disturbance 02/17/2013    Past Surgical History:  Procedure Laterality Date  . ESOPHAGOGASTRODUODENOSCOPY ENDOSCOPY      Family Psychiatric History: As mentioned in initial H&P  Family History:  Family History  Adopted: Yes  Problem Relation Age of Onset  . Alcohol abuse Father     Social History:  Social History   Socioeconomic History  . Marital status: Single    Spouse name: Not on file  . Number of children: 0  . Years of education: Not on file  . Highest education level: Associate degree: occupational, Scientist, product/process development, or vocational program  Occupational History  . Not on file  Social  Needs  . Financial resource strain: Not hard at all  . Food insecurity:    Worry: Never true    Inability: Never true  . Transportation needs:    Medical: No    Non-medical: No  Tobacco Use  . Smoking status: Never Smoker  . Smokeless tobacco: Never Used  Substance and Sexual Activity  . Alcohol use: No  . Drug use: No  . Sexual activity: Never    Birth control/protection: None  Lifestyle  . Physical activity:    Days per week: 4 days     Minutes per session: 60 min  . Stress: To some extent  Relationships  . Social connections:    Talks on phone: Not on file    Gets together: Not on file    Attends religious service: 1 to 4 times per year    Active member of club or organization: No    Attends meetings of clubs or organizations: Never    Relationship status: Never married  Other Topics Concern  . Not on file  Social History Narrative  . Not on file    Allergies:  Allergies  Allergen Reactions  . Amoxicillin Rash    Metabolic Disorder Labs: Lab Results  Component Value Date   HGBA1C 5.1 11/18/2014   MPG 100 11/18/2014   Lab Results  Component Value Date   PROLACTIN 17.6 11/18/2014   Lab Results  Component Value Date   CHOL 112 09/30/2014   TRIG 126 09/30/2014   HDL 37 09/30/2014   CHOLHDL 3.0 09/30/2014   VLDL 25 09/30/2014   LDLCALC 50 09/30/2014   Lab Results  Component Value Date   TSH 1.135 11/18/2014    Therapeutic Level Labs: No results found for: LITHIUM No results found for: VALPROATE No components found for:  CBMZ  Current Medications: Current Outpatient Medications  Medication Sig Dispense Refill  . escitalopram (LEXAPRO) 20 MG tablet Take 1 tablet (20 mg total) by mouth daily. 30 tablet 2  . escitalopram (LEXAPRO) 5 MG tablet Take 1 tablet (5 mg total) by mouth daily. Take along with Lexapro 20 mg daily to make total daily dose of 25 mg once a day. 30 tablet 2  . fexofenadine-pseudoephedrine (ALLEGRA-D ALLERGY & CONGESTION) 180-240 MG 24 hr tablet Take 1 tablet by mouth daily. 90 tablet 4  . fluticasone (FLONASE) 50 MCG/ACT nasal spray USE 1 SPRAY IN EACH NOSTRIL TWICE A DAY (FOLLOWING SINUS RINSES) 16 g 2  . hydrOXYzine (ATARAX/VISTARIL) 25 MG tablet Take 1 tablet (25 mg total) by mouth every 8 (eight) hours as needed for anxiety. 30 tablet 0  . Melatonin 5 MG TABS Take 10 mg by mouth at bedtime.     No current facility-administered medications for this visit.       Musculoskeletal:  Gait & Station: unable to assess since visit was over the telemedicine. Patient leans: N/A  Psychiatric Specialty Exam: Review of Systems  Constitutional: Negative for fever.  Neurological: Negative for seizures.  Psychiatric/Behavioral: Negative for depression, hallucinations, substance abuse and suicidal ideas. The patient is not nervous/anxious and does not have insomnia.     There were no vitals taken for this visit.There is no height or weight on file to calculate BMI.  General Appearance: Casual and Well Groomed  Eye Contact:  Good  Speech:  Clear and Coherent and Normal Rate  Volume:  Normal  Mood:  "good"  Affect:  Appropriate, Congruent and Constricted  Thought Process:  Goal Directed and Linear  Orientation:  Full (Time, Place, and Person)  Thought Content: Logical   Suicidal Thoughts:  No  Homicidal Thoughts:  No  Memory:  Immediate;   Good Recent;   Good Remote;   Good  Judgement:  Good  Insight:  Good  Psychomotor Activity:  Normal  Concentration:  Concentration: Good and Attention Span: Good  Recall:  Good  Fund of Knowledge: Good  Language: Good  Akathisia:  No    AIMS (if indicated): not done  Assets:  Communication Skills Desire for Improvement Financial Resources/Insurance Housing Leisure Time Physical Health Social Support Talents/Skills Transportation Vocational/Educational  ADL's:  Intact  Cognition: WNL  Sleep:  Good   Screenings: GAD-7     Office Visit from 01/08/2018 in [facility] at [facility] Office Visit from 09/16/2017 in [facility] at [facility] Office Visit from 08/24/2016 in [facility] at [facility] Visit from 02/15/2016 in [facility] at [facility]  Total GAD-7 Score  PHQ2-9     Office Visit from 01/08/2018 in [facility] at [facility] Visit from 09/16/2017 in [facility] at [facility] Visit  from 08/24/2016 in [facility] at [facility] Visit from 02[facility]15/2016 in [facility] at [facility] Visit from 11/10/2015 in [facility] Primary Care at [facility] Total Score  PHQ-9 Total Score  Assessment and Plan: This is a 23 year old Hispanic American, 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 Her presentation is consistent with MDD, and generalized/social anxiety d/o with panic attacks. She continues to make progress in her treatment. Anxiety and Depression has been improving.     Treatment Plan Summary:  Problem 1 : Anxiety, improving - Recommended continuing Lexapro to 25 mg daily. Discussed 25 mg daily being an off label dose. Discussed potential benefit, side effects, directions for administration, contact with questions/concerns. - Discussed using Hydroxyzine 12.5 mg to 25 mg Q8hrs PRN for anxiety. Discussed risk vs benefits. Pt verbalized understanding.  Has not used since the last visit.  - Discussed to continue therapy at [facility] with Dr. Leta Speller @ [phone].   Problem 2: Depression; in partial remission - As mentioned above.    -  Recommended to return in 10 weeks or early if needed. 25 mins with patient with greater than 50% counseling and coordination of care. Supportive counseling and psychoeducation was provided.    Follow Up Instructions:    I discussed the assessment and treatment plan with the patient. The patient was provided an opportunity to ask questions and all were answered. The patient agreed with the plan and  demonstrated an understanding of the instructions.   The patient was advised to call back or seek an in-person evaluation if the symptoms worsen or if the condition fails to improve as anticipated.  I provided 20 minutes of non-face-to-face time during this encounter.   Darcel Smalling, MD  Darcel Smalling, MD 08/26/2018, 2:00 PM

## 2018-10-17 IMAGING — CR DG CHEST 2V
2 series · 2 of 2 positions shown · non-contrast
Comparison: None.

CLINICAL DATA: Positive PPD test.  No chest complaints.

EXAM:
CHEST  2 VIEW

[w chest pa]
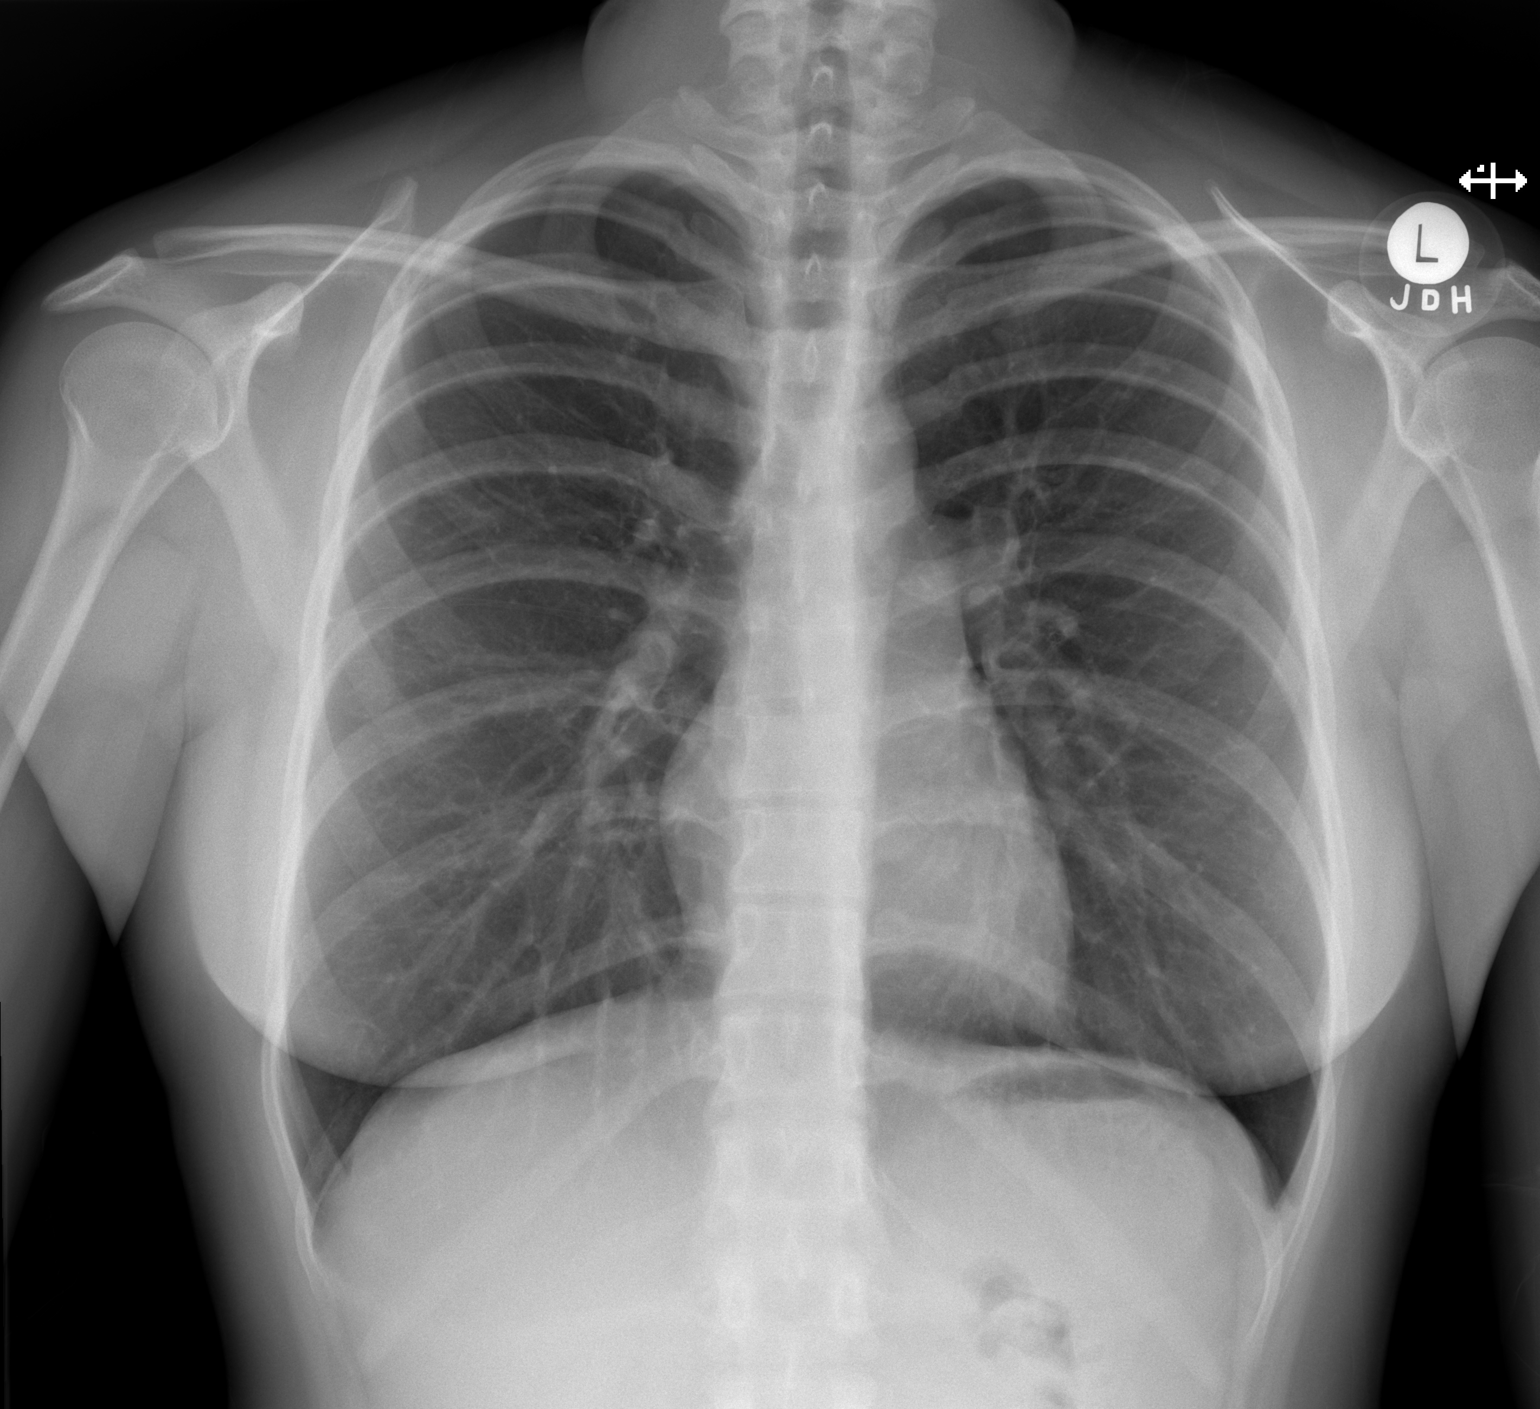

[w chest lat]
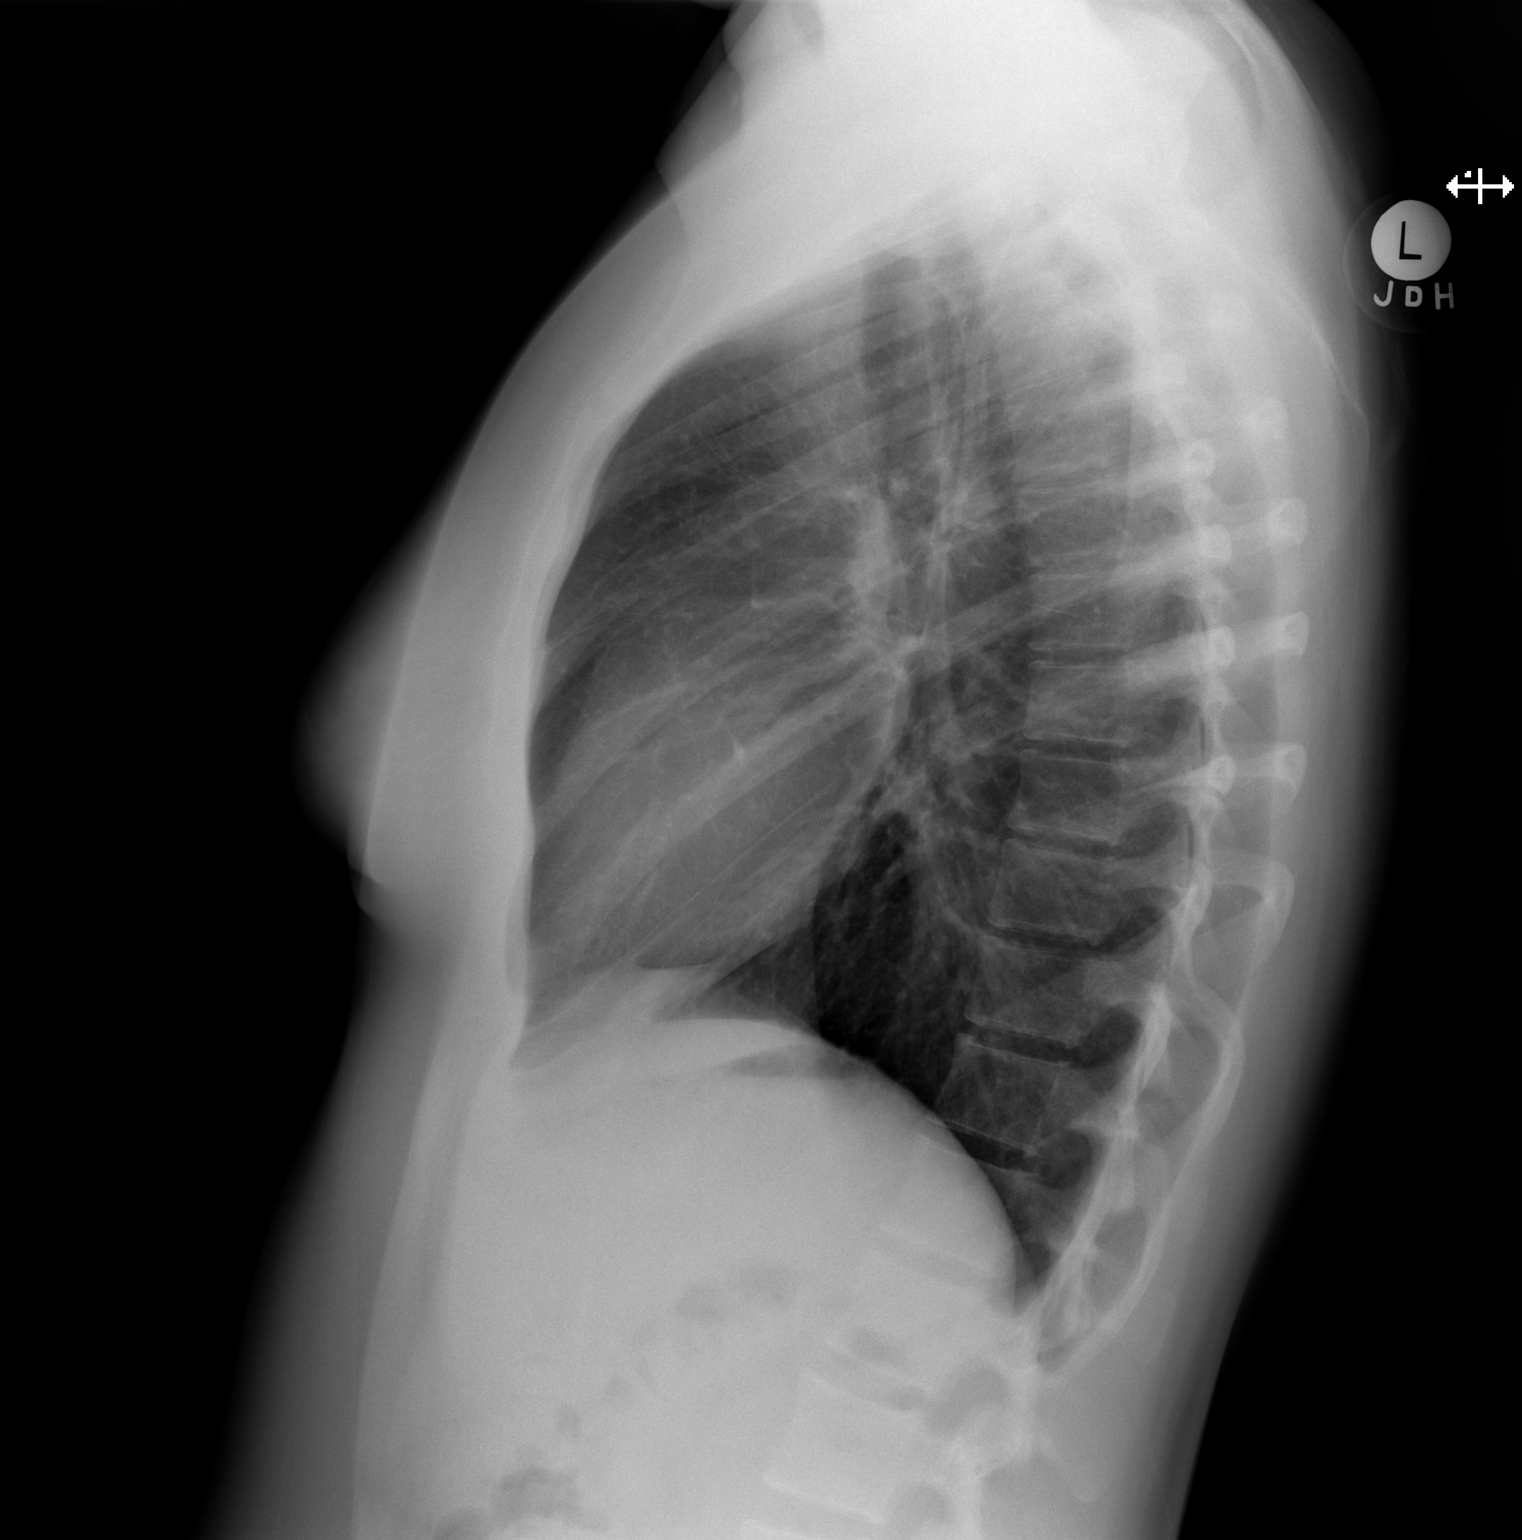

[2 of 2 positions shown; findings below may reference images not displayed]

FINDINGS: The heart size and mediastinal contours are within normal limits.
Both lungs are clear. No pleural effusion or pneumothorax. The
visualized skeletal structures are unremarkable.
IMPRESSION: Normal chest radiographs.

## 2018-10-28 ENCOUNTER — Other Ambulatory Visit (HOSPITAL_COMMUNITY): Payer: Self-pay | Admitting: Psychiatry

## 2018-10-28 ENCOUNTER — Telehealth: Payer: Self-pay

## 2018-10-28 DIAGNOSIS — F418 Other specified anxiety disorders: Secondary | ICD-10-CM

## 2018-10-28 DIAGNOSIS — F3341 Major depressive disorder, recurrent, in partial remission: Secondary | ICD-10-CM

## 2018-10-28 MED ORDER — ESCITALOPRAM OXALATE 5 MG PO TABS: 5.0000 mg | ORAL_TABLET | Freq: Every day | ORAL | 2 refills | Status: DC

## 2018-10-28 MED ORDER — ESCITALOPRAM OXALATE 20 MG PO TABS: 20.0000 mg | ORAL_TABLET | Freq: Every day | ORAL | 2 refills | Status: DC

## 2018-10-28 NOTE — Telephone Encounter (Signed)
according to front desk pt had to r/s her appt with dr. Pricilla Larsson and patient made new appt for  12-03-18. pt needs enough medication  to get to her next appt date.

## 2018-10-28 NOTE — Telephone Encounter (Signed)
sent 

## 2018-11-04 ENCOUNTER — Ambulatory Visit: Admitting: Child and Adolescent Psychiatry

## 2018-12-03 ENCOUNTER — Ambulatory Visit (INDEPENDENT_AMBULATORY_CARE_PROVIDER_SITE_OTHER): Admitting: Child and Adolescent Psychiatry

## 2018-12-03 ENCOUNTER — Other Ambulatory Visit: Payer: Self-pay

## 2018-12-03 ENCOUNTER — Encounter: Payer: Self-pay | Admitting: Child and Adolescent Psychiatry

## 2018-12-03 DIAGNOSIS — F3341 Major depressive disorder, recurrent, in partial remission: Secondary | ICD-10-CM

## 2018-12-03 DIAGNOSIS — F418 Other specified anxiety disorders: Secondary | ICD-10-CM | POA: Diagnosis not present

## 2018-12-03 MED ORDER — ESCITALOPRAM OXALATE 5 MG PO TABS: 5.0000 mg | ORAL_TABLET | Freq: Every day | ORAL | 2 refills | Status: DC

## 2018-12-03 MED ORDER — ESCITALOPRAM OXALATE 20 MG PO TABS: 20.0000 mg | ORAL_TABLET | Freq: Every day | ORAL | 2 refills | Status: DC

## 2018-12-03 NOTE — Progress Notes (Signed)
Virtual Visit via Video Note (partly on the video and partly on phone due to pt was outside)  I connected with Sheri Kline on 12/03/18 at  2:30 PM EDT by a video enabled telemedicine application and verified that I am speaking with the correct person using two identifiers.  Location: Patient: Out at the restaurant Provider: Office   I discussed the limitations of evaluation and management by telemedicine and the availability of in person appointments. The patient expressed understanding and agreed to proceed.    BH MD/PA/NP OP Progress Note  12/03/2018 2:59 PM Sheri Kline  MRN:  [id number]  Chief Complaint: Medication management follow-up for anxiety and depression.   HPI: Sheri Kline was seen and evaluated over telemedicine encounter for routine medication management follow-up for depression and anxiety.  Sheri Kline was initially seen over video however she was outside the report encounter was converted to telephone.  She appeared calm, cooperative, pleasant during the evaluation.  She reports that she has been doing very well, denies any new psychosocial stressors, reports that she moved back to her dorm last week and excited about her senior year at the school, denies feeling depressed or anxious, continues to work at [employer], reports that she has been sleeping well, reports that she has not needed to use hydroxyzine.  She reports that she has been compliant with her Lexapro and that seems to be going well for her.  She denies any thoughts of suicide or self-harm.  She reports that her counselor at [employer] had left to a different school but will be contacting counseling center to establish a new therapist.  She was encouraged to do it.    Visit Diagnosis:    ICD-10-CM   1. Other specified anxiety disorders  F41.8 escitalopram (LEXAPRO) 20 MG tablet    escitalopram (LEXAPRO) 5 MG tablet  2. Recurrent major depressive disorder, in partial remission (HCC)  F33.41 escitalopram  (LEXAPRO) 5 MG tablet    Past Psychiatric History: As mentioned in initial H&P, will be seeing a new counselor [facility] since her previous counselor left.  Past Medical History:  Past Medical History:  Diagnosis Date  . Acne vulgaris 09/30/2014  . Adjustment disorder with mixed anxiety and depressed mood 12/17/2012  . Adopted 05/11/2013  . Anxiety   . Hx of psychiatric hospitalization 11/2012  . Irregular menstrual cycle 11/11/2015  . Mild Environmental and seasonal allergies 11/11/2015  . Sleep disturbance 02/17/2013    Past Surgical History:  Procedure Laterality Date  . ESOPHAGOGASTRODUODENOSCOPY ENDOSCOPY      Family Psychiatric History: As mentioned in initial H&P, reviewed today, no change  Family History:  Family History  Adopted: Yes  Problem Relation Age of Onset  . Alcohol abuse Father     Social History:  Social History   Socioeconomic History  . Marital status: Single    Spouse name: Not on file  . Number of children: 0  . Years of education: Not on file  . Highest education level: Associate degree: occupational, Scientist, product/process developmenttechnical, or vocational program  Occupational History  . Not on file  Social Needs  . Financial resource strain: Not hard at all  . Food insecurity    Worry: Never true    Inability: Never true  . Transportation needs    Medical: No    Non-medical: No  Tobacco Use  . Smoking status: Never Smoker  . Smokeless tobacco: Never Used  Substance and Sexual Activity  .  Alcohol use: No  . Drug use: No  . Sexual activity: Never    Birth control/protection: None  Lifestyle  . Physical activity    Days per week: 4 days    Minutes per session: 60 min  . Stress: To some extent  Relationships  . Social Musicianconnections    Talks on phone: Not on file    Gets together: Not on file    Attends religious service: 1 to 4 times per year    Active member of club or organization: No    Attends meetings of clubs or organizations: Never     Relationship status: Never married  Other Topics Concern  . Not on file  Social History Narrative  . Not on file    Allergies:  Allergies  Allergen Reactions  . Amoxicillin Rash    Metabolic Disorder Labs: Lab Results  Component Value Date   HGBA1C 5.1 11/18/2014   MPG 100 11/18/2014   Lab Results  Component Value Date   PROLACTIN 17.6 11/18/2014   Lab Results  Component Value Date   CHOL 112 09/30/2014   TRIG 126 09/30/2014   HDL 37 09/30/2014   CHOLHDL 3.0 09/30/2014   VLDL 25 09/30/2014   LDLCALC 50 09/30/2014   Lab Results  Component Value Date   TSH 1.135 11/18/2014    Therapeutic Level Labs: No results found for: LITHIUM No results found for: VALPROATE No components found for:  CBMZ  Current Medications: Current Outpatient Medications  Medication Sig Dispense Refill  . escitalopram (LEXAPRO) 20 MG tablet Take 1 tablet (20 mg total) by mouth daily. 30 tablet 2  . escitalopram (LEXAPRO) 5 MG tablet Take 1 tablet (5 mg total) by mouth daily. Take along with Lexapro 20 mg daily to make total daily dose of 25 mg once a day. 30 tablet 2  . fexofenadine-pseudoephedrine (ALLEGRA-D ALLERGY & CONGESTION) 180-240 MG 24 hr tablet Take 1 tablet by mouth daily. 90 tablet 4  . fluticasone (FLONASE) 50 MCG/ACT nasal spray USE 1 SPRAY IN EACH NOSTRIL TWICE A DAY (FOLLOWING SINUS RINSES) 16 g 2  . hydrOXYzine (ATARAX/VISTARIL) 25 MG tablet Take 1 tablet (25 mg total) by mouth every 8 (eight) hours as needed for anxiety. 30 tablet 0  . Melatonin 5 MG TABS Take 10 mg by mouth at bedtime.     No current facility-administered medications for this visit.      Musculoskeletal:  Gait & Station: unable to assess since visit was over the telemedicine. Patient leans: N/A  Psychiatric Specialty Exam: Review of Systems  Constitutional: Negative for malaise/fatigue.  Neurological: Negative for seizures.  Psychiatric/Behavioral: Negative for depression, hallucinations,  substance abuse and suicidal ideas. The patient is not nervous/anxious and does not have insomnia.     There were no vitals taken for this visit.There is no height or weight on file to calculate BMI.  General Appearance: Casual and Well Groomed  Eye Contact:  Good  Speech:  Clear and Coherent and Normal Rate  Volume:  Normal  Mood:  "good"  Affect:  Appropriate, Congruent and Full Range  Thought Process:  Goal Directed and Linear  Orientation:  Full (Time, Place, and Person)  Thought Content: Logical   Suicidal Thoughts:  No  Homicidal Thoughts:  No  Memory:  Immediate;   Good Recent;   Good Remote;   Good  Judgement:  Good  Insight:  Good  Psychomotor Activity:  Normal  Concentration:  Concentration: Good and Attention Span: Good  Recall:  Good  Fund of Knowledge: Good  Language: Good  Akathisia:  No    AIMS (if indicated): not done  Assets:  Communication Skills Desire for Improvement Financial Resources/Insurance Housing Leisure Time [place] Talents/Skills Transportation Vocational/Educational  ADL's:  Intact  Cognition: WNL  Sleep:  Good   Screenings: GAD-7     Office Visit from 01/08/2018 in [place] at [facility] Visit from 09/16/2017 in [place] at [facility] Visit from 08/24/2016 in [place] at [facility] Visit from 02/15/2016 in [place] at [facility]  Total GAD-7 Score  10  4  7  3     PHQ2-9     Office Visit from 01/08/2018 in [place] at [facility] Visit from 09/16/2017 in [place] at [facility] Visit from 08/24/2016 in [place] at [facility] Visit from 02/15/2016 in [place] at [facility] Visit from 11/10/2015 in [place] at [facility] Total Score  4  2  1  1  2   PHQ-9 Total Score  15  7  5  3  4        Assessment and Plan: This is a  23 year old Hispanic American, 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 Her presentation is consistent with MDD, and generalized/social anxiety d/o with panic attacks. She continues to make progress in her treatment. Anxiety and Depression remains improved.     Treatment Plan Summary:  Problem 1 : Anxiety, improving - Continue Lexapro 25 mg daily. Discussed 25 mg daily being an off label dose. Discussed potential benefit, side effects, directions for administration, contact with questions/concerns. - Continue Hydroxyzine 12.5 mg to 25 mg Q8hrs PRN for anxiety, has not used recentl. Discussed risk vs benefits. Pt verbalized understanding.  Has not used since the last visit.  - Discussed to continue therapy at [facility], she will be contacting to establish a new therapist.    Problem 2: Depression; in partial remission - As mentioned above.    -  Recommended to return in 10 weeks or early if needed. 25 mins with patient with greater than 50% counseling and coordination of care. Supportive counseling and psychoeducation was provided.     Orlene Erm, MD 12/03/2018, 2:59 PM

## 2019-01-16 ENCOUNTER — Ambulatory Visit (INDEPENDENT_AMBULATORY_CARE_PROVIDER_SITE_OTHER): Payer: Self-pay | Admitting: Podiatry

## 2019-01-16 ENCOUNTER — Encounter: Payer: Self-pay | Admitting: Podiatry

## 2019-01-16 ENCOUNTER — Other Ambulatory Visit: Payer: Self-pay

## 2019-01-16 VITALS — BP 100/58 | HR 74 | Resp 16

## 2019-01-16 DIAGNOSIS — M79671 Pain in right foot: Secondary | ICD-10-CM

## 2019-01-16 DIAGNOSIS — L600 Ingrowing nail: Secondary | ICD-10-CM

## 2019-01-16 NOTE — Progress Notes (Signed)
Subjective:  Patient ID: Sheri Kline, female    DOB: 05/08/1995,  MRN: [id number]  Chief Complaint  Patient presents with  . Toe Pain    Hallux right - medial border, lost toenail several months ago, when nailstarted to regrow it became sore, red, swollen x 2 months now, tried soaking, cutting and neosporin  . New Patient (Initial Visit)    23 y.o. female presents with the above complaint.    Review of Systems: Negative except as noted in the HPI. Denies N/V/F/Ch.  Past Medical History:  Diagnosis Date  . Acne vulgaris 09/30/2014  . Adjustment disorder with mixed anxiety and depressed mood 12/17/2012  . Adopted 05/11/2013  . Anxiety   . Hx of psychiatric hospitalization 11/2012  . Irregular menstrual cycle 11/11/2015  . Mild Environmental and seasonal allergies 11/11/2015  . Sleep disturbance 02/17/2013    Current Outpatient Medications:  .  escitalopram (LEXAPRO) 20 MG tablet, Take 1 tablet (20 mg total) by mouth daily., Disp: 30 tablet, Rfl: 2 .  escitalopram (LEXAPRO) 5 MG tablet, Take 1 tablet (5 mg total) by mouth daily. Take along with Lexapro 20 mg daily to make total daily dose of 25 mg once a day., Disp: 30 tablet, Rfl: 2 .  fexofenadine-pseudoephedrine (ALLEGRA-D ALLERGY & CONGESTION) 180-240 MG 24 hr tablet, Take 1 tablet by mouth daily., Disp: 90 tablet, Rfl: 4 .  fluticasone (FLONASE) 50 MCG/ACT nasal spray, USE 1 SPRAY IN EACH NOSTRIL TWICE A DAY (FOLLOWING SINUS RINSES), Disp: 16 g, Rfl: 2 .  hydrOXYzine (ATARAX/VISTARIL) 25 MG tablet, Take 1 tablet (25 mg total) by mouth every 8 (eight) hours as needed for anxiety., Disp: 30 tablet, Rfl: 0 .  Melatonin 5 MG TABS, Take 10 mg by mouth at bedtime., Disp: , Rfl:   Social History   Tobacco Use  Smoking Status Never Smoker  Smokeless Tobacco Never Used    Allergies  Allergen Reactions  . Amoxicillin Rash   Objective:   Vitals:   01/16/19 0954  BP: (!) 100/58  Pulse: 74  Resp: 16   There is no height or weight  on file to calculate BMI. Constitutional Well developed. Well nourished.  Vascular Dorsalis pedis pulses palpable bilaterally. Posterior tibial pulses palpable bilaterally. Capillary refill normal to all digits.  No cyanosis or clubbing noted. Pedal hair growth normal.  Neurologic Normal speech. Oriented to person, place, and time. Epicritic sensation to light touch grossly present bilaterally.  Dermatologic Painful ingrowing nail at medial nail borders of the hallux nail right. No other open wounds. No skin lesions.  Orthopedic: Normal joint ROM without pain or crepitus bilaterally. No visible deformities. No bony tenderness.   Radiographs: None Assessment:   1. Ingrown right big toenail   2. Right foot pain    Plan:  Patient was evaluated and treated and all questions answered.  Ingrown Nail, right -Patient elects to proceed with minor surgery to remove ingrown toenail removal today. Consent reviewed and signed by patient. -Ingrown nail excised. See procedure note. -Educated on post-procedure care including soaking. Written instructions provided and reviewed. -Patient to follow up in 2 weeks for nail check.  Procedure: Excision of Ingrown Toenail Location: Right 1st toe medial nail borders. Anesthesia: Lidocaine 1% plain; 1.5 mL and Marcaine 0.5% plain; 1.5 mL, digital block. Skin Prep: Betadine. Dressing: Silvadene; telfa; dry, sterile, compression dressing. Technique: Following skin prep, the toe was exsanguinated and a tourniquet was secured at the base of the toe. The affected nail border was  freed, split with a nail splitter, and excised. Chemical matrixectomy was then performed with phenol and irrigated out with alcohol. The tourniquet was then removed and sterile dressing applied. Disposition: Patient tolerated procedure well. Patient to return in 2 weeks for follow-up.   Return in about 2 weeks (around 01/30/2019).

## 2019-01-16 NOTE — Patient Instructions (Signed)

## 2019-01-30 ENCOUNTER — Ambulatory Visit (INDEPENDENT_AMBULATORY_CARE_PROVIDER_SITE_OTHER): Payer: Self-pay | Admitting: Podiatry

## 2019-01-30 ENCOUNTER — Other Ambulatory Visit: Payer: Self-pay

## 2019-01-30 ENCOUNTER — Encounter: Payer: Self-pay | Admitting: Podiatry

## 2019-01-30 DIAGNOSIS — M79671 Pain in right foot: Secondary | ICD-10-CM

## 2019-01-30 DIAGNOSIS — L600 Ingrowing nail: Secondary | ICD-10-CM

## 2019-01-30 NOTE — Progress Notes (Signed)
Subjective: Sheri Kline is a 23 y.o.  female returns to office today for follow up evaluation after having right Hallux medial border nail avulsion performed. Patient has been soaking using epsom salt and applying topical antibiotic covered with bandaid daily. Patient denies fevers, chills, nausea, vomiting. Denies any calf pain, chest pain, SOB.   Objective:  Vitals: Reviewed  General: Well developed, nourished, in no acute distress, alert and oriented x3   Dermatology: Skin is warm, dry and supple bilateral. Medial right hallux nail border appears to be clean, dry, with mild granular tissue and surrounding scab. There is no surrounding erythema, edema, drainage/purulence. The remaining nails appear unremarkable at this time. There are no other lesions or other signs of infection present.  Neurovascular status: Intact. No lower extremity swelling; No pain with calf compression bilateral.  Musculoskeletal: Decreased tenderness to palpation of the medial right hallux nail fold(s). Muscular strength within normal limits bilateral.   Assesement and Plan: S/p partial nail avulsion, doing well.   -Continue soaking in epsom salts twice a day followed by antibiotic ointment and a band-aid. Can leave uncovered at night. Continue this until completely healed.  -If the area has not healed in 2 weeks, call the office for follow-up appointment, or sooner if any problems arise.  -Monitor for any signs/symptoms of infection. Call the office immediately if any occur or go directly to the emergency room. Call with any questions/concerns.  Boneta Lucks, DPM

## 2019-02-11 ENCOUNTER — Other Ambulatory Visit: Payer: Self-pay

## 2019-02-11 ENCOUNTER — Ambulatory Visit (INDEPENDENT_AMBULATORY_CARE_PROVIDER_SITE_OTHER): Admitting: Child and Adolescent Psychiatry

## 2019-02-11 ENCOUNTER — Encounter: Payer: Self-pay | Admitting: Child and Adolescent Psychiatry

## 2019-02-11 DIAGNOSIS — F3341 Major depressive disorder, recurrent, in partial remission: Secondary | ICD-10-CM

## 2019-02-11 DIAGNOSIS — F418 Other specified anxiety disorders: Secondary | ICD-10-CM

## 2019-02-11 MED ORDER — ESCITALOPRAM OXALATE 5 MG PO TABS: 5.0000 mg | ORAL_TABLET | Freq: Every day | ORAL | 2 refills | Status: DC

## 2019-02-11 MED ORDER — ESCITALOPRAM OXALATE 20 MG PO TABS: 20.0000 mg | ORAL_TABLET | Freq: Every day | ORAL | 2 refills | Status: DC

## 2019-02-11 NOTE — Progress Notes (Signed)
Virtual Visit via Video Note (partly on the video and partly on phone due to pt was outside)  I connected with Sheri Kline on 02/11/19 at  4:00 PM EDT by a video enabled telemedicine application and verified that I am speaking with the correct person using two identifiers.  Location: Patient: Out at the restaurant Provider: Office   I discussed the limitations of evaluation and management by telemedicine and the availability of in person appointments. The patient expressed understanding and agreed to proceed.    BH MD/PA/NP OP Progress Note  02/11/2019 4:30 PM KINZI FREDIANI  MRN:  [id number]  Chief Complaint: Med management follow up for depression and anxiety.    HPI: Sheri Kline was seen and evaluated over telemedicine encounter for med management follow up. She denies any new concerns for today's visit. Reports taht she is doing well overall despite COVID related changes, continues to do well in the school, working at [employer] for 2 days a week, and going to kick boxing and hanging out with friends. She reports that her anxiety is manageable at this time but worries that it may spike up as she will have thesis to submit by the end of spring semester. We discussed to take one day at a time, provided supportive counseling. She reports that she may feel sad at times, but she knows that she has tendencies to overthink so she proactively tries to stop her from overthinking and that has been helpful. Writer commended her for working on this and encouraged to continue doing this as it would help her from spiraling down to anxious or depressed state. She verbalized understanding. She reports that she has been drinking about 1-2 drinks a week, denies any other substance abuse. She denies any suicidal or self harm thoughts. Reports that she has not followed up with therapy because she is doing better recently. She reports taking Lexapro every day and would like to continue with that, she has not  needed to use Atarax. Takes Melatonin PRN for sleep.   Visit Diagnosis:    ICD-10-CM   1. Other specified anxiety disorders  F41.8 escitalopram (LEXAPRO) 20 MG tablet    escitalopram (LEXAPRO) 5 MG tablet  2. Recurrent major depressive disorder, in partial remission (HCC)  F33.41 escitalopram (LEXAPRO) 5 MG tablet    Past Psychiatric History: As mentioned in initial H&P, Not in counseling at this time, but would seek counseling if needed in future at the counseling center.  Past Medical History:  Past Medical History:  Diagnosis Date  . Acne vulgaris 09/30/2014  . Adjustment disorder with mixed anxiety and depressed mood 12/17/2012  . Adopted 05/11/2013  . Anxiety   . Hx of psychiatric hospitalization 11/2012  . Irregular menstrual cycle 11/11/2015  . Mild Environmental and seasonal allergies 11/11/2015  . Sleep disturbance 02/17/2013    Past Surgical History:  Procedure Laterality Date  . ESOPHAGOGASTRODUODENOSCOPY ENDOSCOPY      Family Psychiatric History: As mentioned in initial H&P, reviewed today, no change  Family History:  Family History  Adopted: Yes  Problem Relation Age of Onset  . Alcohol abuse Father     Social History:  Social History   Socioeconomic History  . Marital status: Single    Spouse name: Not on file  . Number of children: 0  . Years of education: Not on file  . Highest education level: Associate degree: occupational, Scientist, product/process development, or vocational program  Occupational History  . Not on file  Social  Needs  . Financial resource strain: Not hard at all  . Food insecurity    Worry: Never true    Inability: Never true  . Transportation needs    Medical: No    Non-medical: No  Tobacco Use  . Smoking status: Never Smoker  . Smokeless tobacco: Never Used  Substance and Sexual Activity  . Alcohol use: No  . Drug use: No  . Sexual activity: Never    Birth control/protection: None  Lifestyle  . Physical activity    Days per week: 4 days    Minutes  per session: 60 min  . Stress: To some extent  Relationships  . Social Herbalist on phone: Not on file    Gets together: Not on file    Attends religious service: 1 to 4 times per year    Active member of club or organization: No    Attends meetings of clubs or organizations: Never    Relationship status: Never married  Other Topics Concern  . Not on file  Social History Narrative  . Not on file    Allergies:  Allergies  Allergen Reactions  . Amoxicillin Rash    Metabolic Disorder Labs: Lab Results  Component Value Date   HGBA1C 5.1 11/18/2014   MPG 100 11/18/2014   Lab Results  Component Value Date   PROLACTIN 17.6 11/18/2014   Lab Results  Component Value Date   CHOL 112 09/30/2014   TRIG 126 09/30/2014   HDL 37 09/30/2014   CHOLHDL 3.0 09/30/2014   VLDL 25 09/30/2014   LDLCALC 50 09/30/2014   Lab Results  Component Value Date   TSH 1.135 11/18/2014    Therapeutic Level Labs: No results found for: LITHIUM No results found for: VALPROATE No components found for:  CBMZ  Current Medications: Current Outpatient Medications  Medication Sig Dispense Refill  . escitalopram (LEXAPRO) 20 MG tablet Take 1 tablet (20 mg total) by mouth daily. 30 tablet 2  . escitalopram (LEXAPRO) 5 MG tablet Take 1 tablet (5 mg total) by mouth daily. Take along with Lexapro 20 mg daily to make total daily dose of 25 mg once a day. 30 tablet 2  . fexofenadine-pseudoephedrine (ALLEGRA-D ALLERGY & CONGESTION) 180-240 MG 24 hr tablet Take 1 tablet by mouth daily. 90 tablet 4  . fluticasone (FLONASE) 50 MCG/ACT nasal spray USE 1 SPRAY IN EACH NOSTRIL TWICE A DAY (FOLLOWING SINUS RINSES) 16 g 2  . hydrOXYzine (ATARAX/VISTARIL) 25 MG tablet Take 1 tablet (25 mg total) by mouth every 8 (eight) hours as needed for anxiety. 30 tablet 0  . Melatonin 5 MG TABS Take 10 mg by mouth at bedtime.     No current facility-administered medications for this visit.       Musculoskeletal:  Gait & Station: unable to assess since visit was over the telemedicine. Patient leans: N/A  Psychiatric Specialty Exam: Review of Systems  Constitutional: Negative for malaise/fatigue.  Neurological: Negative for seizures.  Psychiatric/Behavioral: Negative for depression, hallucinations, substance abuse and suicidal ideas. The patient is not nervous/anxious and does not have insomnia.     There were no vitals taken for this visit.There is no height or weight on file to calculate BMI.  General Appearance: Casual and Well Groomed  Eye Contact:  Good  Speech:  Clear and Coherent and Normal Rate  Volume:  Normal  Mood:  "good"  Affect:  Appropriate, Congruent and Full Range  Thought Process:  Goal Directed and Linear  Orientation:  Full (Time, Place, and Person)  Thought Content: Logical   Suicidal Thoughts:  No  Homicidal Thoughts:  No  Memory:  Immediate;   Good Recent;   Good Remote;   Good  Judgement:  Good  Insight:  Good  Psychomotor Activity:  Normal  Concentration:  Concentration: Good and Attention Span: Good  Recall:  Good  Fund of Knowledge: Good  Language: Good  Akathisia:  No    AIMS (if indicated): not done  Assets:  Communication Skills Desire for Improvement Financial Resources/Insurance Housing Leisure Time Physical Health Social Support Talents/Skills Transportation Vocational/Educational  ADL's:  Intact  Cognition: WNL  Sleep:  Good   Screenings: GAD-7     [facility]  Total GAD-7 Score  10  4  7  3     PHQ2-9     [facility]  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 Her presentation is consistent with MDD, and generalized/social anxiety d/o with panic attacks. She continues to make progress in her treatment. Anxiety and Depression remains stable/improving     Treatment Plan Summary:  Problem 1 : Anxiety, chronic and stable - Continue Lexapro 25 mg daily. Discussed 25 mg daily being an off label dose. Discussed potential benefit, side effects, directions for administration, contact with questions/concerns. - Continue Hydroxyzine 12.5 mg to 25 mg Q8hrs PRN for anxiety, has not used recentl. Discussed risk vs benefits. Pt verbalized understanding.  Has not used since the last visit.  - [facility], she will contact them if needed.     Problem 2: Depression; in partial remission - As mentioned above.   - supportive counseling provided as mentioned above in HPI. Time spent 18-20 minutes for therapy.    -  Recommended to return in 10-12 weeks or early if needed. Darcel Smalling.     Preslie Depasquale M Eretria Manternach, MD 02/11/2019, 4:30 PM

## 2019-05-06 ENCOUNTER — Encounter: Payer: Self-pay | Admitting: Child and Adolescent Psychiatry

## 2019-05-06 ENCOUNTER — Other Ambulatory Visit: Payer: Self-pay

## 2019-05-06 ENCOUNTER — Ambulatory Visit (INDEPENDENT_AMBULATORY_CARE_PROVIDER_SITE_OTHER): Payer: Self-pay | Admitting: Child and Adolescent Psychiatry

## 2019-05-06 DIAGNOSIS — F3341 Major depressive disorder, recurrent, in partial remission: Secondary | ICD-10-CM

## 2019-05-06 DIAGNOSIS — F418 Other specified anxiety disorders: Secondary | ICD-10-CM

## 2019-05-06 MED ORDER — ESCITALOPRAM OXALATE 5 MG PO TABS: 5.0000 mg | ORAL_TABLET | Freq: Every day | ORAL | 2 refills | Status: DC

## 2019-05-06 MED ORDER — ESCITALOPRAM OXALATE 20 MG PO TABS: 20.0000 mg | ORAL_TABLET | Freq: Every day | ORAL | 2 refills | Status: DC

## 2019-05-06 NOTE — Progress Notes (Signed)
Virtual Visit via Video Note I connected with [name] on 05/06/19 at  4:00 PM EST by a video enabled telemedicine application and verified that I am speaking with the correct person using two identifiers.  Location: Patient: pt's apartment Provider: Office   I discussed the limitations of evaluation and management by telemedicine and the availability of in person appointments. The patient expressed understanding and agreed to proceed.    BH MD/PA/NP OP Progress Note  05/06/2019 4:28 PM TEREASE MARCOTTE  MRN:  [id number]  Chief Complaint: Medication management follow up for anxiety and depression.    HPI: Maris A Noguchi was seen and evaluated over telemedicine encounter for medication management follow-up.  My appeared slightly anxious, cooperative, pleasant during the evaluation.  In regards of her mood she reports that her mood has been stable, denies any low lows, denies anhedonia, denies any thoughts of suicide or self-harm, sleeping well except the last 1 week, continues to work at [employer] for about 2 days a week.  She reports that she is in her last semester and has lot of assignments to complete which can stress her out but she able to manage her anxiety well, and has take Atarax PRN for anxiety on on rare occasion. She reports that she is now living in an apartment on the campus and not dorm with her two friends (all of them have separate room) and feels she is not as social as she was before when she lived in dorm. We explored the ways she can be more social and discussed to make n proactive effort to socialize when she has an opportunity. She verbalized understanding. Provided refelctive and empathic listening, and validated patient's experience during the visit. Supportive counseling provided. She reports that she is at a good place with her medication and would like to continue them. She reports that she has not sought counseling at the counseling center because she has been doing  better and will reach out to them if needed.    Visit Diagnosis:    ICD-10-CM   1. Other specified anxiety disorders  F41.8 escitalopram (LEXAPRO) 5 MG tablet    escitalopram (LEXAPRO) 20 MG tablet  2. Recurrent major depressive disorder, in partial remission (HCC)  F33.41 escitalopram (LEXAPRO) 5 MG tablet    Past Psychiatric History: Reviewed today and no change from the last note. Not in counseling at this time, but would seek counseling if needed in future at the counseling center.  Past Medical History:  Past Medical History:  Diagnosis Date  . Acne vulgaris 09/30/2014  . Adjustment disorder with mixed anxiety and depressed mood 12/17/2012  . Adopted 05/11/2013  . Anxiety   . Hx of psychiatric hospitalization 11/2012  . Irregular menstrual cycle 11/11/2015  . Mild Environmental and seasonal allergies 11/11/2015  . Sleep disturbance 02/17/2013    Past Surgical History:  Procedure Laterality Date  . ESOPHAGOGASTRODUODENOSCOPY ENDOSCOPY      Family Psychiatric History: As mentioned in initial H&P, reviewed today, no change   Family History:  Family History  Adopted: Yes  Problem Relation Age of Onset  . Alcohol abuse Father     Social History:  Social History   Socioeconomic History  . Marital status: Single    Spouse name: Not on file  . Number of children: 0  . Years of education: Not on file  . Highest education level: Associate degree: occupational, Scientist, product/process development, or vocational program  Occupational History  . Not on file  Tobacco  Use  . Smoking status: Never Smoker  . Smokeless tobacco: Never Used  Substance and Sexual Activity  . Alcohol use: No  . Drug use: No  . Sexual activity: Never    Birth control/protection: None  Other Topics Concern  . Not on file  Social History Narrative  . Not on file   Social Determinants of Health   Financial Resource Strain:   . Difficulty of Paying Living Expenses: Not on file  Food Insecurity:   . Worried About Paediatric nurse in the Last Year: Not on file  . Ran Out of Food in the Last Year: Not on file  Transportation Needs:   . Lack of Transportation (Medical): Not on file  . Lack of Transportation (Non-Medical): Not on file  Physical Activity:   . Days of Exercise per Week: Not on file  . Minutes of Exercise per Session: Not on file  Stress:   . Feeling of Stress : Not on file  Social Connections:   . Frequency of Communication with Friends and Family: Not on file  . Frequency of Social Gatherings with Friends and Family: Not on file  . Attends Religious Services: Not on file  . Active Member of Clubs or Organizations: Not on file  . Attends Archivist Meetings: Not on file  . Marital Status: Not on file    Allergies:  Allergies  Allergen Reactions  . Amoxicillin Rash    Metabolic Disorder Labs: Lab Results  Component Value Date   HGBA1C 5.1 11/18/2014   MPG 100 11/18/2014   Lab Results  Component Value Date   PROLACTIN 17.6 11/18/2014   Lab Results  Component Value Date   CHOL 112 09/30/2014   TRIG 126 09/30/2014   HDL 37 09/30/2014   CHOLHDL 3.0 09/30/2014   VLDL 25 09/30/2014   LDLCALC 50 09/30/2014   Lab Results  Component Value Date   TSH 1.135 11/18/2014    Therapeutic Level Labs: No results found for: LITHIUM No results found for: VALPROATE No components found for:  CBMZ  Current Medications: Current Outpatient Medications  Medication Sig Dispense Refill  . escitalopram (LEXAPRO) 20 MG tablet Take 1 tablet (20 mg total) by mouth daily. 30 tablet 2  . escitalopram (LEXAPRO) 5 MG tablet Take 1 tablet (5 mg total) by mouth daily. Take along with Lexapro 20 mg daily to make total daily dose of 25 mg once a day. 30 tablet 2  . fexofenadine-pseudoephedrine (ALLEGRA-D ALLERGY & CONGESTION) 180-240 MG 24 hr tablet Take 1 tablet by mouth daily. 90 tablet 4  . fluticasone (FLONASE) 50 MCG/ACT nasal spray USE 1 SPRAY IN EACH NOSTRIL TWICE A DAY (FOLLOWING  SINUS RINSES) 16 g 2  . hydrOXYzine (ATARAX/VISTARIL) 25 MG tablet Take 1 tablet (25 mg total) by mouth every 8 (eight) hours as needed for anxiety. 30 tablet 0  . Melatonin 5 MG TABS Take 10 mg by mouth at bedtime.     No current facility-administered medications for this visit.     Musculoskeletal:  Gait & Station: unable to assess since visit was over the telemedicine. Patient leans: N/A  Psychiatric Specialty Exam: Review of Systems  Constitutional: Negative for malaise/fatigue.  Neurological: Negative for seizures.  Psychiatric/Behavioral: Negative for depression, hallucinations, substance abuse and suicidal ideas. The patient is not nervous/anxious and does not have insomnia.     There were no vitals taken for this visit.There is no height or weight on file to calculate BMI.  General Appearance: Casual and Well Groomed  Eye Contact:  Good  Speech:  Clear and Coherent and Normal Rate  Volume:  Normal  Mood:  "good"  Affect:  Appropriate, Congruent and Full Range  Thought Process:  Goal Directed and Linear  Orientation:  Full (Time, Place, and Person)  Thought Content: Logical   Suicidal Thoughts:  No  Homicidal Thoughts:  No  Memory:  Immediate;   Good Recent;   Good Remote;   Good  Judgement:  Good  Insight:  Good  Psychomotor Activity:  Normal  Concentration:  Concentration: Good and Attention Span: Good  Recall:  Good  Fund of Knowledge: Good  Language: Good  Akathisia:  No    AIMS (if indicated): not done  Assets:  Communication Skills Desire for Improvement Financial Resources/Insurance Housing Leisure Time Physical Health Social Support Talents/Skills Transportation Vocational/Educational  ADL's:  Intact  Cognition: WNL  Sleep:  Good   Screenings: GAD-7     Office Visit from 01/08/2018 in [facility] at [facility] Office Visit from 09/16/2017 in [facility] Care at [facility] Office Visit from 08/24/2016 in [facility] Primary  Care at [facility] Office Visit from 02/15/2016 in [facility] at [facility]  Total GAD-7 Score  10  4  7  3     PHQ2-9     Office Visit from 01/08/2018 in [facility] at [facility] Visit from 09/16/2017 in [facility] at [facility] Visit from 08/24/2016 in [facility] at [facility] Visit from 02/15/2016 in [facility] at [facility] Visit from 11/10/2015 in [facility] Care at [facility] Total Score  4  2  1  1  2   PHQ-9 Total Score  15  7  5  3  4        Assessment and Plan: This is a 24 year old Hispanic American, adopted female, at , currently domiciled at [place] apartment [place], with no significant medical history and psychiatric history significant of anxiety, depression and 1 previous psychiatric hospitalization at [facility] for anxiety, depression and suicidal ideations at the age of 29 has been following with this clinic since the fall of 2019. Her presentation is consistent with MDD, and generalized/social anxiety d/o with panic attacks. She continues to have stability in her anxiety and depression remains in remission at present.     Treatment Plan Summary:  Problem 1 : Anxiety, chronic and stable - Continue Lexapro 25 mg daily. Discussed 25 mg daily being an off label dose. Discussed potential benefit, side effects, directions for administration, contact with questions/concerns. - Continue Hydroxyzine 12.5 mg to 25 mg Q8hrs PRN for anxiety, has not used recently and only uses on rare occassions. Discussed risk vs benefits. Pt verbalized understanding. - Discussed to continue therapy at [facility], she agrees tol contact them if needed.     Problem 2: Depression; in partial remission - As mentioned above.   - supportive counseling provided as mentioned above in HPI. Time spent 18-20 minutes for therapy.    -   Recommended to return in 8-10 weeks or early if needed. 2020, MD 05/06/2019, 4:28 PM

## 2019-07-15 ENCOUNTER — Ambulatory Visit (INDEPENDENT_AMBULATORY_CARE_PROVIDER_SITE_OTHER): Payer: Self-pay | Admitting: Child and Adolescent Psychiatry

## 2019-07-15 ENCOUNTER — Other Ambulatory Visit: Payer: Self-pay

## 2019-07-15 ENCOUNTER — Encounter: Payer: Self-pay | Admitting: Child and Adolescent Psychiatry

## 2019-07-15 DIAGNOSIS — F330 Major depressive disorder, recurrent, mild: Secondary | ICD-10-CM

## 2019-07-15 DIAGNOSIS — F418 Other specified anxiety disorders: Secondary | ICD-10-CM

## 2019-07-15 MED ORDER — BUPROPION HCL ER (XL) 150 MG PO TB24: 150.0000 mg | ORAL_TABLET | Freq: Every day | ORAL | 1 refills | Status: DC

## 2019-07-15 MED ORDER — ESCITALOPRAM OXALATE 20 MG PO TABS: 20.0000 mg | ORAL_TABLET | Freq: Every day | ORAL | 1 refills | Status: DC

## 2019-07-15 MED ORDER — ESCITALOPRAM OXALATE 5 MG PO TABS: 5.0000 mg | ORAL_TABLET | Freq: Every day | ORAL | 1 refills | Status: DC

## 2019-07-15 NOTE — Progress Notes (Signed)
Virtual Visit via Video Note I connected with Sheri Kline on 07/15/19 at  4:00 PM EDT by a video enabled telemedicine application and verified that I am speaking with the correct person using two identifiers.  Location: Patient: pt's apartment Provider: Office   I discussed the limitations of evaluation and management by telemedicine and the availability of in person appointments. The patient expressed understanding and agreed to proceed.    BH MD/PA/NP OP Progress Note  07/15/2019 4:30 PM Sheri Kline  MRN:  [id number]  Chief Complaint: Medication management follow-up for anxiety and depression.   HPI: Sheri Kline was seen and evaluated over telemedicine encounter for medication management follow-up.  She reports that she has been overall doing well however noticing increased anxiety in the context of her college work, and worrying about future.  She reports that she decided to take a gap year and has been overthinking about it.  She reports that she is also noticing worsening of mood since past few days, has some anhedonia, decrease in appetite and has had intermittent passive SI recently.  She denies any active suicidal thoughts intent or plan.  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  She denies any substance abuse, denies marijuana use.  She reports that she has been sleeping overall well but continues to have difficulties intermittently.  She is not in counseling since past few months.    Visit Diagnosis:    ICD-10-CM   1. Mild episode of recurrent major depressive disorder (HCC)  F33.0 escitalopram (LEXAPRO) 5 MG tablet    buPROPion (WELLBUTRIN XL) 150 MG 24 hr tablet  2. Other specified anxiety disorders  F41.8 escitalopram (LEXAPRO) 20 MG tablet    escitalopram (LEXAPRO) 5 MG tablet    Past Psychiatric History: Reviewed today and no change from the last note. Not in  counseling at this time, but would seek counseling if needed in future at the counseling center.  Past Medical History:  Past Medical History:  Diagnosis Date  . Acne vulgaris 09/30/2014  . Adjustment disorder with mixed anxiety and depressed mood 12/17/2012  . Adopted 05/11/2013  . Anxiety   . Hx of psychiatric hospitalization 11/2012  . Irregular menstrual cycle 11/11/2015  . Mild Environmental and seasonal allergies 11/11/2015  . Sleep disturbance 02/17/2013    Past Surgical History:  Procedure Laterality Date  . ESOPHAGOGASTRODUODENOSCOPY ENDOSCOPY      Family Psychiatric History: As mentioned in initial H&P, reviewed today, no change   Family History:  Family History  Adopted: Yes  Problem Relation Age of Onset  . Alcohol abuse Father     Social History:  Social History   Socioeconomic History  . Marital status: Single    Spouse name: Not on file  . Number of children: 0  . Years of education: Not on file  . Highest education level: Associate degree: occupational, Scientist, product/process development, or vocational program  Occupational History  . Not on file  Tobacco Use  . Smoking status: Never Smoker  . Smokeless tobacco: Never Used  Substance and Sexual Activity  . Alcohol use: No  . Drug use: No  . Sexual activity: Never    Birth control/protection: None  Other Topics Concern  . Not on file  Social History Narrative  . Not on file   Social Determinants of Health   Financial Resource Strain:   .  Difficulty of Paying Living Expenses:   Food Insecurity:   . Worried About Charity fundraiser in the Last Year:   . Arboriculturist in the Last Year:   Transportation Needs:   . Film/video editor (Medical):   Marland Kitchen Lack of Transportation (Non-Medical):   Physical Activity:   . Days of Exercise per Week:   . Minutes of Exercise per Session:   Stress:   . Feeling of Stress :   Social Connections:   . Frequency of Communication with Friends and Family:   . Frequency of Social  Gatherings with Friends and Family:   . Attends Religious Services:   . Active Member of Clubs or Organizations:   . Attends Archivist Meetings:   Marland Kitchen Marital Status:     Allergies:  Allergies  Allergen Reactions  . Amoxicillin Rash    Metabolic Disorder Labs: Lab Results  Component Value Date   HGBA1C 5.1 11/18/2014   MPG 100 11/18/2014   Lab Results  Component Value Date   PROLACTIN 17.6 11/18/2014   Lab Results  Component Value Date   CHOL 112 09/30/2014   TRIG 126 09/30/2014   HDL 37 09/30/2014   CHOLHDL 3.0 09/30/2014   VLDL 25 09/30/2014   LDLCALC 50 09/30/2014   Lab Results  Component Value Date   TSH 1.135 11/18/2014    Therapeutic Level Labs: No results found for: LITHIUM No results found for: VALPROATE No components found for:  CBMZ  Current Medications: Current Outpatient Medications  Medication Sig Dispense Refill  . buPROPion (WELLBUTRIN XL) 150 MG 24 hr tablet Take 1 tablet (150 mg total) by mouth daily. 30 tablet 1  . escitalopram (LEXAPRO) 20 MG tablet Take 1 tablet (20 mg total) by mouth daily. 30 tablet 1  . escitalopram (LEXAPRO) 5 MG tablet Take 1 tablet (5 mg total) by mouth daily. Take along with Lexapro 20 mg daily to make total daily dose of 25 mg once a day. 30 tablet 1  . fexofenadine-pseudoephedrine (ALLEGRA-D ALLERGY & CONGESTION) 180-240 MG 24 hr tablet Take 1 tablet by mouth daily. 90 tablet 4  . fluticasone (FLONASE) 50 MCG/ACT nasal spray USE 1 SPRAY IN EACH NOSTRIL TWICE A DAY (FOLLOWING SINUS RINSES) 16 g 2  . hydrOXYzine (ATARAX/VISTARIL) 25 MG tablet Take 1 tablet (25 mg total) by mouth every 8 (eight) hours as needed for anxiety. 30 tablet 0  . Melatonin 5 MG TABS Take 10 mg by mouth at bedtime.     No current facility-administered medications for this visit.     Musculoskeletal:  Gait & Station: unable to assess since visit was over the telemedicine. Patient leans: N/A  Psychiatric Specialty Exam: Review of  Systems  Constitutional: Negative for malaise/fatigue.  Neurological: Negative for seizures.  Psychiatric/Behavioral: Negative for depression, hallucinations, substance abuse and suicidal ideas. The patient is not nervous/anxious and does not have insomnia.     There were no vitals taken for this visit.There is no height or weight on file to calculate BMI.  General Appearance: Casual and Well Groomed  Eye Contact:  Good  Speech:  Clear and Coherent and Normal Rate  Volume:  Normal  Mood:  "good"  Affect:  Appropriate, Congruent, Restricted and anxious  Thought Process:  Goal Directed and Linear  Orientation:  Full (Time, Place, and Person)  Thought Content: Logical   Suicidal Thoughts:  No  Homicidal Thoughts:  No  Memory:  Immediate;   Good Recent;  Good Remote;   Good  Judgement:  Good  Insight:  Good  Psychomotor Activity:  Normal  Concentration:  Concentration: Good and Attention Span: Good  Recall:  Good  Fund of Knowledge: Good  Language: Good  Akathisia:  No    AIMS (if indicated): not done  Assets:  Communication Skills Desire for Improvement Financial Resources/Insurance Housing Leisure Time Physical Health Social Support Talents/Skills Transportation Vocational/Educational  ADL's:  Intact  Cognition: WNL  Sleep:  Good   Screenings: GAD-7     Office Visit from 01/08/2018 in [facility] Office Visit from 09/16/2017 in [facility] at [facility] Visit from 08/24/2016 in [facility] at [facility] Visit from 02[facility]15/2016 in [facility] at [facility]  Total GAD-7 Score  10  4  7  3     PHQ2-9     Office Visit from 01/08/2018 in [facility] at [facility] Visit from 09/16/2017 in [facility] at [facility] Visit from 08/24/2016 in [facility] at [facility] Visit from 02/15/2016 in [facility] at [facility] Visit  from 11/10/2015 in [facility] Primary Care at [facility] Total Score  4  2  1  1  2   PHQ-9 Total Score  15  7  5  3  4        Assessment and Plan: This is a 24 year old Hispanic American, adopted female, at , currently domiciled at an apartment [place], with no significant medical history and psychiatric history significant of anxiety, depression and 1 previous psychiatric hospitalization at [facility] for anxiety, depression and suicidal ideations at the age of 68 has been following with this clinic since the fall of 2019. Her presentation is consistent with MDD, and generalized/social anxiety d/o with panic attacks. She reports worsening of her anxiety and depression in the context of recent stressors. Writer discussed to start counseling again at a college counseling center which she used to go before, discussed to wait regarding medication adjustment given a lot of stress comes from the school work however she would like to address her depression with medication management.  I discussed medication options and recommended Wellbutrin XL 150 mg once a day in addition to her current medications.    Treatment Plan Summary:  Problem 1 : Anxiety, chronic and worse - Continue Lexapro 25 mg daily. Discussed 25 mg daily being an off label dose. Discussed potential benefit, side effects, directions for administration, contact with questions/concerns. - Continue Hydroxyzine 12.5 mg to 25 mg Q8hrs PRN for anxiety, has not used recently and only uses on rare occassions. Discussed risk vs benefits. Pt verbalized understanding. - Start Wellbutrin XL 150 mg daily  Side effects including but not limited to nausea, vomiting, diarrhea, constipation, headaches, dizziness, black box warning of suicidal thoughts with Wellbutrin were discussed with pt and pt provided informed consent.  - Discussed to continue therapy at [facility], she agrees tol  contact them if needed.     Problem 2: Depression; worse - As mentioned above.   - supportive counseling provided as mentioned above in HPI. Time spent 18-20 minutes for therapy.    -  Recommended to return in 6-8 weeks or early if needed. 18, MD 07/15/2019, 4:30 PM

## 2019-09-02 ENCOUNTER — Other Ambulatory Visit: Payer: Self-pay

## 2019-09-02 ENCOUNTER — Encounter: Payer: Self-pay | Admitting: Child and Adolescent Psychiatry

## 2019-09-02 ENCOUNTER — Telehealth (INDEPENDENT_AMBULATORY_CARE_PROVIDER_SITE_OTHER): Payer: Self-pay | Admitting: Child and Adolescent Psychiatry

## 2019-09-02 DIAGNOSIS — F418 Other specified anxiety disorders: Secondary | ICD-10-CM

## 2019-09-02 DIAGNOSIS — F3341 Major depressive disorder, recurrent, in partial remission: Secondary | ICD-10-CM

## 2019-09-02 MED ORDER — HYDROXYZINE HCL 25 MG PO TABS: 25.0000 mg | ORAL_TABLET | Freq: Three times a day (TID) | ORAL | 0 refills | Status: DC | PRN

## 2019-09-02 MED ORDER — ESCITALOPRAM OXALATE 20 MG PO TABS: 20.0000 mg | ORAL_TABLET | Freq: Every day | ORAL | 1 refills | Status: DC

## 2019-09-02 MED ORDER — ESCITALOPRAM OXALATE 5 MG PO TABS: 5.0000 mg | ORAL_TABLET | Freq: Every day | ORAL | 1 refills | Status: DC

## 2019-09-02 NOTE — Progress Notes (Signed)
Virtual Visit via Video Note I connected with [name] on 09/02/19 at  3:00 PM EDT by a video enabled telemedicine application and verified that I am speaking with the correct person using two identifiers.  Location: Patient: pt's apartment Provider: Office   I discussed the limitations of evaluation and management by telemedicine and the availability of in person appointments. The patient expressed understanding and agreed to proceed.    BH MD/PA/NP OP Progress Note  09/02/2019 3:35 PM CLORINDA WYBLE  MRN:  [id number]  Chief Complaint: Follow-up for anxiety, depression.   HPI: Starlit A Demello was seen and evaluated over telemedicine encounter for medication management follow-up.  She reports that she tried Wellbutrin XL 150 mg for 3 days but because she was having severe headaches she stopped taking them and has only continue taking Lexapro 25 mg once a day and hydroxyzine as needed.  She however reports that with change of environment from her apartment [place] the campus to moving back with parents, graduating college has lifted a lot of stress on her and she has been doing better with her mood and describes her mood as "great".  She denies any low lows or depression.  She reports that she has been eating and sleeping well, denies anhedonia and denies any thoughts of suicide or self-harm.  She reports that she continues to feel anxious however believes it is at her baseline and normal anxiety.  She reports that she has decided to take a gap year this year and is working at [employer] and also being trained for medical scribe and she is excited about this job.  Discussed that since her depression is better and her anxiety is at baseline we will continue with Lexapro 25 mg and hydroxyzine as needed.  She verbalized understanding.  We also discussed about therapy and she reports that she has not been able to find a therapist, we discussed her new therapist may be starting here at this clinic in about  a month and writer will refer once the start.  She verbalized understanding.  She reports that her mother had asked her to check with this writer about her difficulties asking questions.  Discussed about emotions behind difficulties asking questions which is often a fear/anxiety of unknown of how other person might be responding.  We discussed about trying to lean into uncomfortable feeling versus avoiding asking questions which is more comfortable to help feel better about self. Marcella was very receptive and agreed to practice asking questions more.   Visit Diagnosis:    ICD-10-CM   1. Other specified anxiety disorders  F41.8 escitalopram (LEXAPRO) 20 MG tablet    escitalopram (LEXAPRO) 5 MG tablet    hydrOXYzine (ATARAX/VISTARIL) 25 MG tablet  2. Recurrent major depressive disorder, in partial remission (HCC)  F33.41 escitalopram (LEXAPRO) 5 MG tablet    Past Psychiatric History: Reviewed today and no change from the last note. Not in counseling at this time, but would seek counseling if needed in future at the counseling center.  Past Medical History:  Past Medical History:  Diagnosis Date  . Acne vulgaris 09/30/2014  . Adjustment disorder with mixed anxiety and depressed mood 12/17/2012  . Adopted 05/11/2013  . Anxiety   . Hx of psychiatric hospitalization 11/2012  . Irregular menstrual cycle 11/11/2015  . Mild Environmental and seasonal allergies 11/11/2015  . Sleep disturbance 02/17/2013    Past Surgical History:  Procedure Laterality Date  . ESOPHAGOGASTRODUODENOSCOPY ENDOSCOPY  Family Psychiatric History: As mentioned in initial H&P, reviewed today, no change   Family History:  Family History  Adopted: Yes  Problem Relation Age of Onset  . Alcohol abuse Father     Social History:  Social History   Socioeconomic History  . Marital status: Single    Spouse name: Not on file  . Number of children: 0  . Years of education: Not on file  . Highest education level: Associate  degree: occupational, Scientist, product/process development, or vocational program  Occupational History  . Not on file  Tobacco Use  . Smoking status: Never Smoker  . Smokeless tobacco: Never Used  Substance and Sexual Activity  . Alcohol use: No  . Drug use: No  . Sexual activity: Never    Birth control/protection: None  Other Topics Concern  . Not on file  Social History Narrative  . Not on file   Social Determinants of Health   Financial Resource Strain:   . Difficulty of Paying Living Expenses:   Food Insecurity:   . Worried About Programme researcher, broadcasting/film/video in the Last Year:   . Barista in the Last Year:   Transportation Needs:   . Freight forwarder (Medical):   Marland Kitchen Lack of Transportation (Non-Medical):   Physical Activity:   . Days of Exercise per Week:   . Minutes of Exercise per Session:   Stress:   . Feeling of Stress :   Social Connections:   . Frequency of Communication with Friends and Family:   . Frequency of Social Gatherings with Friends and Family:   . Attends Religious Services:   . Active Member of Clubs or Organizations:   . Attends Banker Meetings:   Marland Kitchen Marital Status:     Allergies:  Allergies  Allergen Reactions  . Amoxicillin Rash    Metabolic Disorder Labs: Lab Results  Component Value Date   HGBA1C 5.1 11/18/2014   MPG 100 11/18/2014   Lab Results  Component Value Date   PROLACTIN 17.6 11/18/2014   Lab Results  Component Value Date   CHOL 112 09/30/2014   TRIG 126 09/30/2014   HDL 37 09/30/2014   CHOLHDL 3.0 09/30/2014   VLDL 25 09/30/2014   LDLCALC 50 09/30/2014   Lab Results  Component Value Date   TSH 1.135 11/18/2014    Therapeutic Level Labs: No results found for: LITHIUM No results found for: VALPROATE No components found for:  CBMZ  Current Medications: Current Outpatient Medications  Medication Sig Dispense Refill  . escitalopram (LEXAPRO) 20 MG tablet Take 1 tablet (20 mg total) by mouth daily. 30 tablet 1  .  escitalopram (LEXAPRO) 5 MG tablet Take 1 tablet (5 mg total) by mouth daily. Take along with Lexapro 20 mg daily to make total daily dose of 25 mg once a day. 30 tablet 1  . fexofenadine-pseudoephedrine (ALLEGRA-D ALLERGY & CONGESTION) 180-240 MG 24 hr tablet Take 1 tablet by mouth daily. 90 tablet 4  . fluticasone (FLONASE) 50 MCG/ACT nasal spray USE 1 SPRAY IN EACH NOSTRIL TWICE A DAY (FOLLOWING SINUS RINSES) 16 g 2  . hydrOXYzine (ATARAX/VISTARIL) 25 MG tablet Take 1 tablet (25 mg total) by mouth every 8 (eight) hours as needed for anxiety. 30 tablet 0  . Melatonin 5 MG TABS Take 10 mg by mouth at bedtime.     No current facility-administered medications for this visit.     Musculoskeletal:  Gait & Station: unable to assess since visit was  over the telemedicine. Patient leans: N/A  Psychiatric Specialty Exam: Review of Systems  Constitutional: Negative for malaise/fatigue.  Neurological: Negative for seizures.  Psychiatric/Behavioral: Negative for depression, hallucinations, substance abuse and suicidal ideas. The patient is not nervous/anxious and does not have insomnia.     There were no vitals taken for this visit.There is no height or weight on file to calculate BMI.  General Appearance: Casual and Well Groomed  Eye Contact:  Good  Speech:  Clear and Coherent and Normal Rate  Volume:  Normal  Mood:  "good"  Affect:  Appropriate, Congruent and Full Range  Thought Process:  Goal Directed and Linear  Orientation:  Full (Time, Place, and Person)  Thought Content: Logical   Suicidal Thoughts:  No  Homicidal Thoughts:  No  Memory:  Immediate;   Good Recent;   Good Remote;   Good  Judgement:  Good  Insight:  Good  Psychomotor Activity:  Normal  Concentration:  Concentration: Good and Attention Span: Good  Recall:  Good  Fund of Knowledge: Good  Language: Good  Akathisia:  No    AIMS (if indicated): not done  Assets:  Communication Skills Desire for  Improvement Financial Resources/Insurance Housing Leisure Time Physical Health Social Support Talents/Skills Transportation Vocational/Educational  ADL's:  Intact  Cognition: WNL  Sleep:  Good   Screenings: GAD-7     Office Visit from 01/08/2018 in [facility] Office Visit from 09/16/2017 in [facility] Office Visit from 08/24/2016 in [facility] at [facility] Office Visit from 02[facility]15/2016 in [facility] at [facility]  Total GAD-7 Score  10  4  7  3     PHQ2-9     Office Visit from 01/08/2018 in [facility] Visit from 09/16/2017 in [facility] at [facility] Visit from 08/24/2016 in [facility] at [facility] Visit from 02/15/2016 in [facility] at [facility] Visit from 11/10/2015 in [facility] at [facility] Total Score  4  2  1  1  2   PHQ-9 Total Score  15  7  5  3  4        Assessment and Plan: This is a 24 year old Hispanic American, adopted female, at , currently domiciled with parents, with no significant medical history and psychiatric history significant of anxiety, depression and 1 previous psychiatric hospitalization at [facility] for anxiety, depression and suicidal ideations at the age of 62 has been following with this clinic since the fall of 2019. Her presentation is consistent with  Recurrent MDD(in remission at present), and generalized/social anxiety d/o with panic attacks. She reports improvement with mood and anxiety in the context of lack of stress of college after her graduation and moving back with parents and planning to take a gap year and work.   Treatment Plan Summary:  Problem 1 : Anxiety, chronic and stable - Continue Lexapro 25 mg daily. Discussed 25 mg daily being an off label dose. Discussed potential benefit, side effects, directions for  administration, contact with questions/concerns. - Continue Hydroxyzine 12.5 mg to 25 mg Q8hrs PRN for anxiety, has not used recently and only uses on rare occassions. Discussed risk vs benefits. Pt verbalized understanding. - Stop Wellbutrin XL 150 mg daily, had headaches on it and doing better despite stopping it.  Side effects including but  not limited to nausea, vomiting, diarrhea, constipation, headaches, dizziness, black box warning of suicidal thoughts with Wellbutrin were discussed with pt and pt provided informed consent.  - Therapy referral to [facility] once new therapist starts.   Problem 2: Depression; recurrent in remission.   - As mentioned above.   - supportive counseling provided as mentioned above in HPI. Time spent 20 minutes for therapy.    -  Recommended to return in 6-8 weeks or early if needed. Orlene Erm, MD 09/02/2019, 3:35 PM

## 2019-11-04 ENCOUNTER — Telehealth (INDEPENDENT_AMBULATORY_CARE_PROVIDER_SITE_OTHER): Payer: Self-pay | Admitting: Child and Adolescent Psychiatry

## 2019-11-04 ENCOUNTER — Other Ambulatory Visit: Payer: Self-pay

## 2019-11-04 ENCOUNTER — Encounter: Payer: Self-pay | Admitting: Child and Adolescent Psychiatry

## 2019-11-04 DIAGNOSIS — F3341 Major depressive disorder, recurrent, in partial remission: Secondary | ICD-10-CM

## 2019-11-04 DIAGNOSIS — F418 Other specified anxiety disorders: Secondary | ICD-10-CM

## 2019-11-04 MED ORDER — ESCITALOPRAM OXALATE 20 MG PO TABS: 20.0000 mg | ORAL_TABLET | Freq: Every day | ORAL | 2 refills | Status: DC

## 2019-11-04 MED ORDER — ESCITALOPRAM OXALATE 5 MG PO TABS: 5.0000 mg | ORAL_TABLET | Freq: Every day | ORAL | 2 refills | Status: DC

## 2019-11-04 NOTE — Progress Notes (Signed)
Virtual Visit via Video Note I connected with Sheri Kline on 11/04/19 at  4:30 PM EDT by a video enabled telemedicine application and verified that I am speaking with the correct person using two identifiers.  Location: Patient: pt's work Provider: Office   I discussed the limitations of evaluation and management by telemedicine and the availability of in person appointments. The patient expressed understanding and agreed to proceed.    BH MD/PA/NP OP Progress Note  11/04/2019 4:47 PM IMO CUMBIE  MRN:  [id number]  Chief Complaint: Medication management follow-up for anxiety and depression.   HPI: Sheri Kline was seen and evaluated over telemedicine encounter for medication management follow-up.  She was present at her work and reported that she is at a private space to talk to this Clinical research associate.  She reports that in the interim since her last appointment she had changed her work from [employer] to [employer].  She reports that her work at [employer] was stressful, and she was feeling more anxious but since moving to [employer] she is feeling much better.  She reports that she is also now considered a full-time employee and her coworkers are better.  She reports that her mood has been good, denies any periods of depression or sadness, rates her mood at 7 out of 10(10 = happiest) on most days, denies anhedonia, denies problems with sleep and eating well.  She denies any suicidal thoughts.  In regards of anxiety she reports that her anxiety has been stable and rates it at 5 out of 10(10 = most anxious).  She continues to live with her parents and denies any psychosocial stressors at present.She reports that she has continued to take her medications as prescribed and denies any side effects from them.  She reports that she has not needed to take hydroxyzine as much and keeps it as a backup.  She denies any alcohol or other drug abuse.  Writer offered her referral for therapy at [facility], she  however declined and reports that she is doing well and her mother is looking for family counseling.  We discussed to continue her Lexapro at 25 mg once a day and hydroxyzine as needed.  She verbalizes understanding and agrees with.    Visit Diagnosis:    ICD-10-CM   1. Other specified anxiety disorders  F41.8 escitalopram (LEXAPRO) 20 MG tablet    escitalopram (LEXAPRO) 5 MG tablet  2. Recurrent major depressive disorder, in partial remission (HCC)  F33.41 escitalopram (LEXAPRO) 5 MG tablet    Past Psychiatric History: Reviewed today and no change from the last note. Not in counseling at this time, but would seek counseling if needed in future at the counseling center.  Past Medical History:  Past Medical History:  Diagnosis Date  . Acne vulgaris 09/30/2014  . Adjustment disorder with mixed anxiety and depressed mood 12/17/2012  . Adopted 05/11/2013  . Anxiety   . Hx of psychiatric hospitalization 11/2012  . Irregular menstrual cycle 11/11/2015  . Mild Environmental and seasonal allergies 11/11/2015  . Sleep disturbance 02/17/2013    Past Surgical History:  Procedure Laterality Date  . ESOPHAGOGASTRODUODENOSCOPY ENDOSCOPY      Family Psychiatric History: As mentioned in initial H&P, reviewed today, no change   Family History:  Family History  Adopted: Yes  Problem Relation Age of Onset  . Alcohol abuse Father     Social History:  Social History   Socioeconomic History  . Marital status: Single  Spouse name: Not on file  . Number of children: 0  . Years of education: Not on file  . Highest education level: Associate degree: occupational, Scientist, product/process development, or vocational program  Occupational History  . Not on file  Tobacco Use  . Smoking status: Never Smoker  . Smokeless tobacco: Never Used  Vaping Use  . Vaping Use: Never used  Substance and Sexual Activity  . Alcohol use: No  . Drug use: No  . Sexual activity: Never    Birth control/protection: None  Other Topics Concern   . Not on file  Social History Narrative  . Not on file   Social Determinants of Health   Financial Resource Strain:   . Difficulty of Paying Living Expenses:   Food Insecurity:   . Worried About Programme researcher, broadcasting/film/video in the Last Year:   . Barista in the Last Year:   Transportation Needs:   . Freight forwarder (Medical):   Marland Kitchen Lack of Transportation (Non-Medical):   Physical Activity:   . Days of Exercise per Week:   . Minutes of Exercise per Session:   Stress:   . Feeling of Stress :   Social Connections:   . Frequency of Communication with Friends and Family:   . Frequency of Social Gatherings with Friends and Family:   . Attends Religious Services:   . Active Member of Clubs or Organizations:   . Attends Banker Meetings:   Marland Kitchen Marital Status:     Allergies:  Allergies  Allergen Reactions  . Amoxicillin Rash    Metabolic Disorder Labs: Lab Results  Component Value Date   HGBA1C 5.1 11/18/2014   MPG 100 11/18/2014   Lab Results  Component Value Date   PROLACTIN 17.6 11/18/2014   Lab Results  Component Value Date   CHOL 112 09/30/2014   TRIG 126 09/30/2014   HDL 37 09/30/2014   CHOLHDL 3.0 09/30/2014   VLDL 25 09/30/2014   LDLCALC 50 09/30/2014   Lab Results  Component Value Date   TSH 1.135 11/18/2014    Therapeutic Level Labs: No results found for: LITHIUM No results found for: VALPROATE No components found for:  CBMZ  Current Medications: Current Outpatient Medications  Medication Sig Dispense Refill  . escitalopram (LEXAPRO) 20 MG tablet Take 1 tablet (20 mg total) by mouth daily. 30 tablet 2  . escitalopram (LEXAPRO) 5 MG tablet Take 1 tablet (5 mg total) by mouth daily. Take along with Lexapro 20 mg daily to make total daily dose of 25 mg once a day. 30 tablet 2  . fexofenadine-pseudoephedrine (ALLEGRA-D ALLERGY & CONGESTION) 180-240 MG 24 hr tablet Take 1 tablet by mouth daily. 90 tablet 4  . fluticasone (FLONASE) 50  MCG/ACT nasal spray USE 1 SPRAY IN EACH NOSTRIL TWICE A DAY (FOLLOWING SINUS RINSES) 16 g 2  . hydrOXYzine (ATARAX/VISTARIL) 25 MG tablet Take 1 tablet (25 mg total) by mouth every 8 (eight) hours as needed for anxiety. 30 tablet 0  . Melatonin 5 MG TABS Take 10 mg by mouth at bedtime.     No current facility-administered medications for this visit.     Musculoskeletal:  Gait & Station: unable to assess since visit was over the telemedicine. Patient leans: N/A  Psychiatric Specialty Exam: Review of Systems  Constitutional: Negative for malaise/fatigue.  Neurological: Negative for seizures.  Psychiatric/Behavioral: Negative for depression, hallucinations, substance abuse and suicidal ideas. The patient is not nervous/anxious and does not have insomnia.  There were no vitals taken for this visit.There is no height or weight on file to calculate BMI.  General Appearance: Casual and Well Groomed  Eye Contact:  Good  Speech:  Clear and Coherent and Normal Rate  Volume:  Normal  Mood:  "good"  Affect:  Appropriate, Congruent and Full Range  Thought Process:  Goal Directed and Linear  Orientation:  Full (Time, Place, and Person)  Thought Content: Logical   Suicidal Thoughts:  No  Homicidal Thoughts:  No  Memory:  Immediate;   Good Recent;   Good Remote;   Good  Judgement:  Good  Insight:  Good  Psychomotor Activity:  Normal  Concentration:  Concentration: Good and Attention Span: Good  Recall:  Good  Fund of Knowledge: Good  Language: Good  Akathisia:  No    AIMS (if indicated): not done  Assets:  Communication Skills Desire for Improvement Financial Resources/Insurance Housing Leisure Time Physical Health Social Support Talents/Skills Transportation Vocational/Educational  ADL's:  Intact  Cognition: WNL  Sleep:  Good   Screenings: GAD-7     Office Visit from 01/08/2018 in [facility] at [facility] Office Visit from 09/16/2017 in [facility] at [facility] Office Visit from 08/24/2016 in [facility] Office Visit from 02/15/2016 in [facility] at [facility]  Total GAD-7 Score 10 4 7 3     PHQ2-9     Office Visit from 01/08/2018 in [facility] at [facility] Visit from 09/16/2017 in [facility] at [facility] Visit from 08/24/2016 in [facility] at [facility] Visit from 02/15/2016 [facility] at [facility] Visit from 11/10/2015 in [facility] at [facility] Total Score 4 2 1 1 2   PHQ-9 Total Score 15 7 5 3 4        Assessment and Plan: This is a 24 year old Hispanic American, adopted female, from taking a gap year, currently domiciled with parents, with no significant medical history and psychiatric history significant of anxiety, depression and 1 previous psychiatric hospitalization at [facility] for anxiety, depression and suicidal ideations at the age of 37 has been following with this clinic since the fall of 2019.   Her presentation is consistent with  Recurrent MDD(in remission at present), and generalized/social anxiety d/o with panic attacks. She reports improvement with mood and stability anxiety.   Treatment Plan Summary:  Problem 1 : Anxiety, chronic and stable - Continue Lexapro 25 mg daily. Discussed 25 mg daily being an off label dose. Discussed potential benefit, side effects, directions for administration, contact with questions/concerns. - Continue Hydroxyzine 12.5 mg to 25 mg Q8hrs PRN for anxiety, has not used recently and only uses on rare occassions. Discussed risk vs benefits. Pt verbalized understanding..  - Therapy referral to [facility] was offered, pt declined and reports that she is doing well at this time. .   Problem 2: Depression; recurrent in remission.   - As mentioned above.    -  Recommended to return in 8-10 weeks or early if  needed. 18, MD 11/04/2019, 4:47 PM

## 2020-01-18 ENCOUNTER — Telehealth (INDEPENDENT_AMBULATORY_CARE_PROVIDER_SITE_OTHER): Payer: Self-pay | Admitting: Child and Adolescent Psychiatry

## 2020-01-18 ENCOUNTER — Other Ambulatory Visit: Payer: Self-pay

## 2020-01-18 DIAGNOSIS — F3341 Major depressive disorder, recurrent, in partial remission: Secondary | ICD-10-CM

## 2020-01-18 DIAGNOSIS — F418 Other specified anxiety disorders: Secondary | ICD-10-CM

## 2020-01-18 MED ORDER — ESCITALOPRAM OXALATE 20 MG PO TABS: 20.0000 mg | ORAL_TABLET | Freq: Every day | ORAL | 2 refills | Status: DC

## 2020-01-18 MED ORDER — ESCITALOPRAM OXALATE 5 MG PO TABS: 5.0000 mg | ORAL_TABLET | Freq: Every day | ORAL | 2 refills | Status: DC

## 2020-01-18 NOTE — Progress Notes (Signed)
Virtual Visit via Video Note I connected with Sheri Kline on 01/18/20 at  2:30 PM EDT by a video enabled telemedicine application and verified that I am speaking with the correct person using two identifiers.  Location: Patient: pt's work Provider: Office   I discussed the limitations of evaluation and management by telemedicine and the availability of in person appointments. The patient expressed understanding and agreed to proceed.    BH MD/PA/NP OP Progress Note  01/18/2020 2:51 PM Sheri Kline  MRN:  [id number]  Chief Complaint: Medication management follow-up for anxiety and depression.   HPI: Sheri Kline was seen and evaluated over telemedicine encounter for medication management follow-up.  She was present at her work and spoke from her car parked in a parking lot.  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  She reports that work has been going well and she enjoys her current work much better as compared to her last work.  She reports that work has been keeping busy which has been good for her.  She denies any periods of low lows or depression.  She also reports that she has been doing much better with her anxiety and her anxiety has been stable.  She denies any new psychosocial stressors.  She reports that she has been eating well, sleeping well, spends time with her family and never she gets some free time.  She denies any suicidal or homicidal thoughts.  She reports that she has been adherent to her medications denies any problems with them.  She reports that she would like to continue with current medications.  She reports that she has not needed to take hydroxyzine for very long time.  In regards of therapy she reports that she is planning to attend family therapy with her parents and also will most likely see the therapist for individual counseling as well.  We discussed to continue her  current medications and she verbalized understanding.   Visit Diagnosis:    ICD-10-CM   1. Other specified anxiety disorders  F41.8 escitalopram (LEXAPRO) 20 MG tablet    escitalopram (LEXAPRO) 5 MG tablet  2. Recurrent major depressive disorder, in partial remission (HCC)  F33.41 escitalopram (LEXAPRO) 5 MG tablet    Past Psychiatric History: Reviewed today and no change from the last note. Not in counseling at this time, but would seek counseling if needed in future at the counseling center.  Past Medical History:  Past Medical History:  Diagnosis Date  . Acne vulgaris 09/30/2014  . Adjustment disorder with mixed anxiety and depressed mood 12/17/2012  . Adopted 05/11/2013  . Anxiety   . Hx of psychiatric hospitalization 11/2012  . Irregular menstrual cycle 11/11/2015  . Mild Environmental and seasonal allergies 11/11/2015  . Sleep disturbance 02/17/2013    Past Surgical History:  Procedure Laterality Date  . ESOPHAGOGASTRODUODENOSCOPY ENDOSCOPY      Family Psychiatric History: As mentioned in initial H&P, reviewed today, no change   Family History:  Family History  Adopted: Yes  Problem Relation Age of Onset  . Alcohol abuse Father     Social History:  Social History   Socioeconomic History  . Marital status: Single    Spouse name: Not on file  . Number of children: 0  . Years of education: Not on file  . Highest education level: Associate degree: occupational, Scientist, product/process development, or vocational program  Occupational History  .  Not on file  Tobacco Use  . Smoking status: Never Smoker  . Smokeless tobacco: Never Used  Vaping Use  . Vaping Use: Never used  Substance and Sexual Activity  . Alcohol use: No  . Drug use: No  . Sexual activity: Never    Birth control/protection: None  Other Topics Concern  . Not on file  Social History Narrative  . Not on file   Social Determinants of Health   Financial Resource Strain:   . Difficulty of Paying Living Expenses: Not on file   Food Insecurity:   . Worried About Programme researcher, broadcasting/film/video in the Last Year: Not on file  . Ran Out of Food in the Last Year: Not on file  Transportation Needs:   . Lack of Transportation (Medical): Not on file  . Lack of Transportation (Non-Medical): Not on file  Physical Activity:   . Days of Exercise per Week: Not on file  . Minutes of Exercise per Session: Not on file  Stress:   . Feeling of Stress : Not on file  Social Connections:   . Frequency of Communication with Friends and Family: Not on file  . Frequency of Social Gatherings with Friends and Family: Not on file  . Attends Religious Services: Not on file  . Active Member of Clubs or Organizations: Not on file  . Attends Banker Meetings: Not on file  . Marital Status: Not on file    Allergies:  Allergies  Allergen Reactions  . Amoxicillin Rash    Metabolic Disorder Labs: Lab Results  Component Value Date   HGBA1C 5.1 11/18/2014   MPG 100 11/18/2014   Lab Results  Component Value Date   PROLACTIN 17.6 11/18/2014   Lab Results  Component Value Date   CHOL 112 09/30/2014   TRIG 126 09/30/2014   HDL 37 09/30/2014   CHOLHDL 3.0 09/30/2014   VLDL 25 09/30/2014   LDLCALC 50 09/30/2014   Lab Results  Component Value Date   TSH 1.135 11/18/2014    Therapeutic Level Labs: No results found for: LITHIUM No results found for: VALPROATE No components found for:  CBMZ  Current Medications: Current Outpatient Medications  Medication Sig Dispense Refill  . escitalopram (LEXAPRO) 20 MG tablet Take 1 tablet (20 mg total) by mouth daily. 30 tablet 2  . escitalopram (LEXAPRO) 5 MG tablet Take 1 tablet (5 mg total) by mouth daily. Take along with Lexapro 20 mg daily to make total daily dose of 25 mg once a day. 30 tablet 2  . fexofenadine-pseudoephedrine (ALLEGRA-D ALLERGY & CONGESTION) 180-240 MG 24 hr tablet Take 1 tablet by mouth daily. 90 tablet 4  . fluticasone (FLONASE) 50 MCG/ACT nasal spray USE 1  SPRAY IN EACH NOSTRIL TWICE A DAY (FOLLOWING SINUS RINSES) 16 g 2  . hydrOXYzine (ATARAX/VISTARIL) 25 MG tablet Take 1 tablet (25 mg total) by mouth every 8 (eight) hours as needed for anxiety. 30 tablet 0  . Melatonin 5 MG TABS Take 10 mg by mouth at bedtime.     No current facility-administered medications for this visit.     Musculoskeletal:  Gait & Station: unable to assess since visit was over the telemedicine. Patient leans: N/A  Psychiatric Specialty Exam: Review of Systems  Constitutional: Negative for malaise/fatigue.  Neurological: Negative for seizures.  Psychiatric/Behavioral: Negative for depression, hallucinations, substance abuse and suicidal ideas. The patient is not nervous/anxious and does not have insomnia.     There were no vitals taken  for this visit.There is no height or weight on file to calculate BMI.  General Appearance: Casual and Well Groomed  Eye Contact:  Good  Speech:  Clear and Coherent and Normal Rate  Volume:  Normal  Mood:  "pretty good.."  Affect:  Appropriate, Congruent and Full Range  Thought Process:  Goal Directed and Linear  Orientation:  Full (Time, Place, and Person)  Thought Content: Logical   Suicidal Thoughts:  No  Homicidal Thoughts:  No  Memory:  Immediate;   Good Recent;   Good Remote;   Good  Judgement:  Good  Insight:  Good  Psychomotor Activity:  Normal  Concentration:  Concentration: Good and Attention Span: Good  Recall:  Good  Fund of Knowledge: Good  Language: Good  Akathisia:  No    AIMS (if indicated): not done  Assets:  Communication Skills Desire for Improvement Financial Resources/Insurance Housing Leisure Time Physical Health Social Support Talents/Skills Transportation Vocational/Educational  ADL's:  Intact  Cognition: WNL  Sleep:  Good   Screenings: GAD-7     Office Visit from 01/08/2018 in [facility] Office Visit from 09/16/2017 in [facility] at [facility] Office Visit from 08/24/2016 in [facility] at [facility] Visit from 02/15/2016 in [facility]  Total GAD-7 Score 10 4 7 3     PHQ2-9     [facility] Visit from 01/08/2018 in [facility] at [facility] Visit from 09/16/2017 in [facility] Care at [facility] Visit from 08/24/2016 in [facility] at [facility] Visit from 02/15/2016 in [facility] at [facility] Visit from 11/10/2015 in [facility] Care at [facility] Total Score 4 2 1 1 2   PHQ-9 Total Score 15 7 5 3 4        Assessment and Plan: This is a 24 year old Hispanic American, adopted female, from taking a gap year, employed at 38, currently domiciled with parents, with no significant medical history and psychiatric history significant of anxiety, depression and 1 previous psychiatric hospitalization at [facility] for anxiety, depression and suicidal ideations at the age of 3 has been following with this clinic since the fall of 2019.   Her presentation is consistent with  Recurrent MDD(in remission at present), and generalized/social anxiety d/o with panic attacks which appears stable at present.   Treatment Plan Summary:  Problem 1 : Anxiety, chronic and stable - Continue Lexapro 25 mg daily. Discussed 25 mg daily being an off label dose at the last increase. Discussed potential benefit, side effects, directions for administration, contact with questions/concerns at initiation. - Continue Hydroxyzine 12.5 mg to 25 mg Q8hrs PRN for anxiety, has not used recently and only uses on rare occassions. Discussed risk vs benefits. Pt verbalized understanding..  - Therapy referral to [facility] was offered, pt declined and reports that she is doing well at this time. .   Problem 2: Depression; recurrent in remission.   - As mentioned above.    -  Recommended to return in 8-10  weeks or early if needed. 18, MD 01/18/2020, 2:51 PM

## 2020-03-31 ENCOUNTER — Telehealth (INDEPENDENT_AMBULATORY_CARE_PROVIDER_SITE_OTHER): Payer: Self-pay | Admitting: Child and Adolescent Psychiatry

## 2020-03-31 ENCOUNTER — Other Ambulatory Visit: Payer: Self-pay

## 2020-03-31 DIAGNOSIS — F3341 Major depressive disorder, recurrent, in partial remission: Secondary | ICD-10-CM

## 2020-03-31 DIAGNOSIS — F418 Other specified anxiety disorders: Secondary | ICD-10-CM

## 2020-03-31 MED ORDER — ESCITALOPRAM OXALATE 20 MG PO TABS: 20.0000 mg | ORAL_TABLET | Freq: Every day | ORAL | 2 refills | Status: DC

## 2020-03-31 MED ORDER — ESCITALOPRAM OXALATE 5 MG PO TABS: 5.0000 mg | ORAL_TABLET | Freq: Every day | ORAL | 2 refills | Status: DC

## 2020-03-31 NOTE — Progress Notes (Signed)
Virtual Visit via Video Note I connected with Sheri Kline on 03/31/20 at  2:30 PM EST by a video enabled telemedicine application and verified that I am speaking with the correct person using two identifiers.  Location: Patient: pt's work Provider: Office   I discussed the limitations of evaluation and management by telemedicine and the availability of in person appointments. The patient expressed understanding and agreed to proceed.    BH MD/PA/NP OP Progress Note  03/31/2020 2:52 PM Sheri Kline  MRN:  [id number]  Chief Complaint: Medication management follow-up for anxiety and depression.   HPI: Sheri Kline was seen and evaluated over telemedicine encounter for medication management follow-up.  She was out shopping and was in a public area and reported that she has been wearing headphones and can still talk.   She denies any new concerns for today's appointment and reports that she has continued to do well since last appointment.  She reports that she has not had any low lows or periods of depression.  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  She reports that work has been busy and she gets tired but she enjoys her work.  She denies any problems with sleep or appetite.  In regards of anxiety she reports that her anxiety has remained neutral around 5 out of 10(10 = most anxious).  She denies any thoughts of suicide or self-harm.  She reports that she has been compliant to her medications and requesting medication refills.  We discussed to continue with current medications and follow-up in 3 months or earlier if needed.  She also reports that she has started seeing therapist again.  Writer encouraged her to continue working with therapist.  Visit Diagnosis:    ICD-10-CM   1. Other specified anxiety disorders  F41.8 escitalopram (LEXAPRO) 20 MG tablet    escitalopram (LEXAPRO) 5 MG tablet  2. Recurrent major  depressive disorder, in partial remission (HCC)  F33.41 escitalopram (LEXAPRO) 5 MG tablet    Past Psychiatric History: Reviewed today and no change from the last note. Not in counseling at this time, but would seek counseling if needed in future at the counseling center.  Past Medical History:  Past Medical History:  Diagnosis Date  . Acne vulgaris 09/30/2014  . Adjustment disorder with mixed anxiety and depressed mood 12/17/2012  . Adopted 05/11/2013  . Anxiety   . Hx of psychiatric hospitalization 11/2012  . Irregular menstrual cycle 11/11/2015  . Mild Environmental and seasonal allergies 11/11/2015  . Sleep disturbance 02/17/2013    Past Surgical History:  Procedure Laterality Date  . ESOPHAGOGASTRODUODENOSCOPY ENDOSCOPY      Family Psychiatric History: As mentioned in initial H&P, reviewed today, no change   Family History:  Family History  Adopted: Yes  Problem Relation Age of Onset  . Alcohol abuse Father     Social History:  Social History   Socioeconomic History  . Marital status: Single    Spouse name: Not on file  . Number of children: 0  . Years of education: Not on file  . Highest education level: Associate degree: occupational, Scientist, product/process development, or vocational program  Occupational History  . Not on file  Tobacco Use  . Smoking status: Never Smoker  . Smokeless tobacco: Never Used  Vaping Use  . Vaping Use: Never used  Substance and Sexual Activity  . Alcohol use: No  . Drug use:  No  . Sexual activity: Never    Birth control/protection: None  Other Topics Concern  . Not on file  Social History Narrative  . Not on file   Social Determinants of Health   Financial Resource Strain: Not on file  Food Insecurity: Not on file  Transportation Needs: Not on file  Physical Activity: Not on file  Stress: Not on file  Social Connections: Not on file    Allergies:  Allergies  Allergen Reactions  . Amoxicillin Rash    Metabolic Disorder Labs: Lab Results   Component Value Date   HGBA1C 5.1 11/18/2014   MPG 100 11/18/2014   Lab Results  Component Value Date   PROLACTIN 17.6 11/18/2014   Lab Results  Component Value Date   CHOL 112 09/30/2014   TRIG 126 09/30/2014   HDL 37 09/30/2014   CHOLHDL 3.0 09/30/2014   VLDL 25 09/30/2014   LDLCALC 50 09/30/2014   Lab Results  Component Value Date   TSH 1.135 11/18/2014    Therapeutic Level Labs: No results found for: LITHIUM No results found for: VALPROATE No components found for:  CBMZ  Current Medications: Current Outpatient Medications  Medication Sig Dispense Refill  . escitalopram (LEXAPRO) 20 MG tablet Take 1 tablet (20 mg total) by mouth daily. 30 tablet 2  . escitalopram (LEXAPRO) 5 MG tablet Take 1 tablet (5 mg total) by mouth daily. Take along with Lexapro 20 mg daily to make total daily dose of 25 mg once a day. 30 tablet 2  . fexofenadine-pseudoephedrine (ALLEGRA-D ALLERGY & CONGESTION) 180-240 MG 24 hr tablet Take 1 tablet by mouth daily. 90 tablet 4  . fluticasone (FLONASE) 50 MCG/ACT nasal spray USE 1 SPRAY IN EACH NOSTRIL TWICE A DAY (FOLLOWING SINUS RINSES) 16 g 2  . hydrOXYzine (ATARAX/VISTARIL) 25 MG tablet Take 1 tablet (25 mg total) by mouth every 8 (eight) hours as needed for anxiety. 30 tablet 0  . Melatonin 5 MG TABS Take 10 mg by mouth at bedtime.     No current facility-administered medications for this visit.     Musculoskeletal:  Gait & Station: unable to assess since visit was over the telemedicine. Patient leans: N/A  Psychiatric Specialty Exam: Review of Systems  Constitutional: Negative for malaise/fatigue.  Neurological: Negative for seizures.  Psychiatric/Behavioral: Negative for depression, hallucinations, substance abuse and suicidal ideas. The patient is not nervous/anxious and does not have insomnia.     There were no vitals taken for this visit.There is no height or weight on file to calculate BMI.  General Appearance: Casual and Well  Groomed  Eye Contact:  Good  Speech:  Clear and Coherent and Normal Rate  Volume:  Normal  Mood:  "pretty good..."  Affect:  Appropriate, Congruent and Full Range  Thought Process:  Goal Directed and Linear  Orientation:  Full (Time, Place, and Person)  Thought Content: Logical   Suicidal Thoughts:  No  Homicidal Thoughts:  No  Memory:  Immediate;   Good Recent;   Good Remote;   Good  Judgement:  Good  Insight:  Good  Psychomotor Activity:  Normal  Concentration:  Concentration: Good and Attention Span: Good  Recall:  Good  Fund of Knowledge: Good  Language: Good  Akathisia:  No    AIMS (if indicated): not done  Assets:  Communication Skills Desire for Improvement Financial Resources/Insurance Housing Leisure Time Physical Health Social Support Talents/Skills Transportation Vocational/Educational  ADL's:  Intact  Cognition: WNL  Sleep:  Good  Screenings: GAD-7   Flowsheet Row Office Visit from 01/08/2018 in [facility] at [facility] Visit from 09/16/2017 in [facility] at [facility] Visit from 08/24/2016 in [facility] at [facility] Visit from 02/15/2016 [facility] at [facility]  Total GAD-7 Score 10 4 7 3     PHQ2-9   Flowsheet Row Office Visit from 01/08/2018 in [facility] at [facility] Visit from 09/16/2017 in [facility] at [facility] Visit from 08/24/2016 in [facility] at [facility] Visit from 02/15/2016 in [facility] at [facility] Visit from 11/10/2015 in [facility] at [facility] Total Score 4 2 1 1 2   PHQ-9 Total Score 15 7 5 3 4        Assessment and Plan: This is a 24 year old Hispanic American, adopted female, from taking a gap year, employed at 38, currently domiciled with parents, with no significant medical history and psychiatric  history significant of anxiety, depression and 1 previous psychiatric hospitalization at [facility] for anxiety, depression and suicidal ideations at the age of 53 has been following with this clinic since the fall of 2019.   Her presentation is consistent with  Recurrent MDD(in remission at present), and generalized/social anxiety d/o with panic attacks which appears stable at present.   Treatment Plan Summary:  Problem 1 : Anxiety, chronic and stable - Continue Lexapro 25 mg daily. Discussed 25 mg daily being an off label dose at the last increase. Discussed potential benefit, side effects, directions for administration, contact with questions/concerns at initiation. - Continue Hydroxyzine 12.5 mg to 25 mg Q8hrs PRN for anxiety, has not used recently and only uses on rare occassions. Discussed risk vs benefits. Pt verbalized understanding..  - Continue ind therapy. .   Problem 2: Depression; recurrent in remission.   - As mentioned above.    -  Recommended to return in 12 weeks or early if needed. [facility], MD 03/31/2020, 2:52 PM

## 2020-06-30 ENCOUNTER — Other Ambulatory Visit: Payer: Self-pay

## 2020-06-30 ENCOUNTER — Telehealth (INDEPENDENT_AMBULATORY_CARE_PROVIDER_SITE_OTHER): Payer: No Typology Code available for payment source | Admitting: Child and Adolescent Psychiatry

## 2020-06-30 DIAGNOSIS — F3341 Major depressive disorder, recurrent, in partial remission: Secondary | ICD-10-CM

## 2020-06-30 DIAGNOSIS — F418 Other specified anxiety disorders: Secondary | ICD-10-CM | POA: Diagnosis not present

## 2020-06-30 MED ORDER — ESCITALOPRAM OXALATE 5 MG PO TABS: 5.0000 mg | ORAL_TABLET | Freq: Every day | ORAL | 2 refills | Status: DC

## 2020-06-30 MED ORDER — ESCITALOPRAM OXALATE 20 MG PO TABS: 20.0000 mg | ORAL_TABLET | Freq: Every day | ORAL | 2 refills | Status: DC

## 2020-06-30 NOTE — Progress Notes (Signed)
Virtual Visit via Video Note I connected with Sheri Kline on 06/30/20 at  2:30 PM EDT by a video enabled telemedicine application and verified that I am speaking with the correct person using two identifiers.  Location: Patient: [place] Provider: Office   I discussed the limitations of evaluation and management by telemedicine and the availability of in person appointments. The patient expressed understanding and agreed to proceed.    BH MD/PA/NP OP Progress Note  06/30/2020 2:54 PM CALISE DUNCKEL  MRN:  [id number]  Chief Complaint: Medication management follow-up for anxiety and depression.   HPI: Sheri Kline was seen and evaluated over telemedicine encounter for medication management follow-up.  She was in her car in a parking lot and was evaluated alone.   Sheri Kline denies any new concerns for today's appointment and reports that she has continued to do well.  She reports that she has occasional times when she may feel sad or anxious but denies any persistent sadness or constant anxiety.  She reports that over the last 2 months she is actually feeling more relaxed and less anxious.  She reports that she continues to see her therapist about once every other week and working on some of the things to help her with her mood and anxiety.  She reports that her therapist suggested that she may change her Lexapro to a different medication because he thought that medication stops working after about 2 to 3 years.  My on the other hand denies worsening of mood or anxiety and discussed that medication does not lose efficacy over the period of time but does have what we call "poop out phenomena" in which SSRIs can lose its efficacy fairly.  We discussed that given her stability and symptoms does not appear to be the case and therefore would recommend continuing with Lexapro at 25 mg once a day.  She reports that she has been sleeping well, denies problems with appetite or energy, denies any thoughts of suicide or  self-harm, denies any substance abuse.   Visit Diagnosis:    ICD-10-CM   1. Other specified anxiety disorders  F41.8 escitalopram (LEXAPRO) 20 MG tablet    escitalopram (LEXAPRO) 5 MG tablet  2. Recurrent major depressive disorder, in partial remission (HCC)  F33.41 escitalopram (LEXAPRO) 5 MG tablet    Past Psychiatric History: Reviewed today and no change from the last note. Not in counseling at this time, but would seek counseling if needed in future at the counseling center.  Past Medical History:  Past Medical History:  Diagnosis Date  . Acne vulgaris 09/30/2014  . Adjustment disorder with mixed anxiety and depressed mood 12/17/2012  . Adopted 05/11/2013  . Anxiety   . Hx of psychiatric hospitalization 11/2012  . Irregular menstrual cycle 11/11/2015  . Mild Environmental and seasonal allergies 11/11/2015  . Sleep disturbance 02/17/2013    Past Surgical History:  Procedure Laterality Date  . ESOPHAGOGASTRODUODENOSCOPY ENDOSCOPY      Family Psychiatric History: As mentioned in initial H&P, reviewed today, no change   Family History:  Family History  Adopted: Yes  Problem Relation Age of Onset  . Alcohol abuse Father     Social History:  Social History   Socioeconomic History  . Marital status: Single    Spouse name: Not on file  . Number of children: 0  . Years of education: Not on file  . Highest education level: Associate degree: occupational, Scientist, product/process development, or vocational program  Occupational History  . Not on  file  Tobacco Use  . Smoking status: Never Smoker  . Smokeless tobacco: Never Used  Vaping Use  . Vaping Use: Never used  Substance and Sexual Activity  . Alcohol use: No  . Drug use: No  . Sexual activity: Never    Birth control/protection: None  Other Topics Concern  . Not on file  Social History Narrative  . Not on file   Social Determinants of Health   Financial Resource Strain: Not on file  Food Insecurity: Not on file  Transportation Needs: Not  on file  Physical Activity: Not on file  Stress: Not on file  Social Connections: Not on file    Allergies:  Allergies  Allergen Reactions  . Amoxicillin Rash    Metabolic Disorder Labs: Lab Results  Component Value Date   HGBA1C 5.1 11/18/2014   MPG 100 11/18/2014   Lab Results  Component Value Date   PROLACTIN 17.6 11/18/2014   Lab Results  Component Value Date   CHOL 112 09/30/2014   TRIG 126 09/30/2014   HDL 37 09/30/2014   CHOLHDL 3.0 09/30/2014   VLDL 25 09/30/2014   LDLCALC 50 09/30/2014   Lab Results  Component Value Date   TSH 1.135 11/18/2014    Therapeutic Level Labs: No results found for: LITHIUM No results found for: VALPROATE No components found for:  CBMZ  Current Medications: Current Outpatient Medications  Medication Sig Dispense Refill  . escitalopram (LEXAPRO) 20 MG tablet Take 1 tablet (20 mg total) by mouth daily. 30 tablet 2  . escitalopram (LEXAPRO) 5 MG tablet Take 1 tablet (5 mg total) by mouth daily. Take along with Lexapro 20 mg daily to make total daily dose of 25 mg once a day. 30 tablet 2  . fexofenadine-pseudoephedrine (ALLEGRA-D ALLERGY & CONGESTION) 180-240 MG 24 hr tablet Take 1 tablet by mouth daily. 90 tablet 4  . fluticasone (FLONASE) 50 MCG/ACT nasal spray USE 1 SPRAY IN EACH NOSTRIL TWICE A DAY (FOLLOWING SINUS RINSES) 16 g 2  . hydrOXYzine (ATARAX/VISTARIL) 25 MG tablet Take 1 tablet (25 mg total) by mouth every 8 (eight) hours as needed for anxiety. 30 tablet 0  . Melatonin 5 MG TABS Take 10 mg by mouth at bedtime.     No current facility-administered medications for this visit.     Musculoskeletal:  Gait & Station: unable to assess since visit was over the telemedicine. Patient leans: N/A  Psychiatric Specialty Exam: Review of Systems  Constitutional: Negative for malaise/fatigue.  Neurological: Negative for seizures.  Psychiatric/Behavioral: Negative for depression, hallucinations, substance abuse and suicidal  ideas. The patient is not nervous/anxious and does not have insomnia.     There were no vitals taken for this visit.There is no height or weight on file to calculate BMI.  General Appearance: Casual and Well Groomed  Eye Contact:  Good  Speech:  Clear and Coherent and Normal Rate  Volume:  Normal  Mood:  "good..."  Affect:  Appropriate, Congruent and Full Range  Thought Process:  Goal Directed and Linear  Orientation:  Full (Time, Place, and Person)  Thought Content: Logical   Suicidal Thoughts:  No  Homicidal Thoughts:  No  Memory:  Immediate;   Good Recent;   Good Remote;   Good  Judgement:  Good  Insight:  Good  Psychomotor Activity:  Normal  Concentration:  Concentration: Good and Attention Span: Good  Recall:  Good  Fund of Knowledge: Good  Language: Good  Akathisia:  No  AIMS (if indicated): not done  Assets:  Communication Skills Desire for Improvement Financial Resources/Insurance Housing Leisure Time Physical Health Social Support Talents/Skills Transportation Vocational/Educational  ADL's:  Intact  Cognition: WNL  Sleep:  Good   Screenings: GAD-7   Flowsheet Row Office Visit from 01/08/2018 in [facility] at [facility] Visit from 09/16/2017 in [facility] Visit from 08/24/2016 in [facility] at [facility] Visit from 02/15/2016 in [facility] at [facility]  Total GAD-7 Score 10 4 7 3     PHQ2-9   Flowsheet Row Video Visit from 06/30/2020 in [facility] Office Visit from 01/08/2018 in [facility] at [facility] Visit from 09/16/2017 in [facility] at [facility] Visit from 08/24/2016 in [facility] at [facility] Visit from 02/15/2016 in [facility] Care at [facility] Total Score 1 4 2 1 1   PHQ-9 Total Score -- 15 7 5 3        Assessment and Plan: This is a 25 year old  Hispanic American, adopted female, from taking a gap year, employed at 23, currently domiciled with parents, with no significant medical history and psychiatric history significant of anxiety, depression and 1 previous psychiatric hospitalization at [facility] for anxiety, depression and suicidal ideations at the age of 36 has been following with this clinic since the fall of 2019.   Her presentation is consistent with  Recurrent MDD(in remission at present), and generalized/social anxiety d/o with panic attacks which appears stable at present on review of symptoms today on 06/30/20  Treatment Plan Summary:  Problem 1 : Anxiety, chronic and stable - Continue Lexapro 25 mg daily. Discussed 25 mg daily being an off label dose at the last increase. Discussed potential benefit, side effects, directions for administration, contact with questions/concerns at initiation. - Continue Hydroxyzine 12.5 mg to 25 mg Q8hrs PRN for anxiety, has not used recently and only uses on rare occassions. Discussed risk vs benefits. Pt verbalized understanding..  - Continue ind therapy - Mathew Sixberry  Problem 2: Depression; recurrent in remission.   - As mentioned above.    -  Recommended to return in 12 weeks or early if needed.   MDM = 2 or more chronic stable conditions + med management.     18, MD 06/30/2020, 2:54 PM

## 2020-09-29 ENCOUNTER — Encounter: Payer: Self-pay | Admitting: Child and Adolescent Psychiatry

## 2020-09-29 ENCOUNTER — Telehealth (INDEPENDENT_AMBULATORY_CARE_PROVIDER_SITE_OTHER): Payer: No Typology Code available for payment source | Admitting: Child and Adolescent Psychiatry

## 2020-09-29 ENCOUNTER — Other Ambulatory Visit: Payer: Self-pay

## 2020-09-29 DIAGNOSIS — F418 Other specified anxiety disorders: Secondary | ICD-10-CM

## 2020-09-29 DIAGNOSIS — F3341 Major depressive disorder, recurrent, in partial remission: Secondary | ICD-10-CM | POA: Diagnosis not present

## 2020-09-29 MED ORDER — ESCITALOPRAM OXALATE 5 MG PO TABS
5.0000 mg | ORAL_TABLET | Freq: Every day | ORAL | 2 refills | Status: DC
Start: 2020-09-29 — End: 2021-01-05

## 2020-09-29 MED ORDER — ESCITALOPRAM OXALATE 20 MG PO TABS: 20.0000 mg | ORAL_TABLET | Freq: Every day | ORAL | 2 refills | Status: DC

## 2020-09-29 NOTE — Progress Notes (Signed)
Virtual Visit via Video Note I connected with Sheri Kline on 09/29/20 at  2:30 PM EDT by a video enabled telemedicine application and verified that I am speaking with the correct person using two identifiers.  Location: Patient: [place] Provider: Office   I discussed the limitations of evaluation and management by telemedicine and the availability of in person appointments. The patient expressed understanding and agreed to proceed.    BH MD/PA/NP OP Progress Note  09/29/2020 2:56 PM Sheri Kline  MRN:  [id number]  Chief Complaint: Medication management follow-up for anxiety and depression.  HPI: Sheri Kline was seen and evaluated over telemedicine encounter for medication management follow-up.  She was at her work and spoke with this Clinical research associate from a private space.  Sheri Kline reports that she has continued to do well, reports that her anxiety is stable and rates it around 4 out of 10(10 = most anxious), denies any low lows or depressed mood, reports that her social life is improving and she has been hanging out with some of her friends from her work.  She reports that she has continued to work at [employer] and currently serves the role of Oncologist and hoping to continue to progress at work.  She reports that she is eating well, sleeping well, denies any suicidal thoughts, denies any HI.  She reports that she has been compliant to her medications.  She denies seeing her therapist recently because she has not felt the need recently.  I discussed with her to continue with current medications and follow-up in 3 months or earlier if needed.  She reports that she has not needed to take hydroxyzine and therefore does not treat refill on it.    Visit Diagnosis:    ICD-10-CM   1. Other specified anxiety disorders  F41.8 escitalopram (LEXAPRO) 5 MG tablet    escitalopram (LEXAPRO) 20 MG tablet    2. Recurrent major depressive disorder, in partial remission (HCC)  F33.41 escitalopram  (LEXAPRO) 5 MG tablet      Past Psychiatric History: Reviewed today and no change from the last note. Not in counseling at this time, but would seek counseling if needed in future at the counseling center.  Past Medical History:  Past Medical History:  Diagnosis Date   Acne vulgaris 09/30/2014   Adjustment disorder with mixed anxiety and depressed mood 12/17/2012   Adopted 05/11/2013   Anxiety    Hx of psychiatric hospitalization 11/2012   Irregular menstrual cycle 11/11/2015   Mild Environmental and seasonal allergies 11/11/2015   Sleep disturbance 02/17/2013    Past Surgical History:  Procedure Laterality Date   ESOPHAGOGASTRODUODENOSCOPY ENDOSCOPY      Family Psychiatric History: As mentioned in initial H&P, reviewed today, no change   Family History:  Family History  Adopted: Yes  Problem Relation Age of Onset   Alcohol abuse Father     Social History:  Social History   Socioeconomic History   Marital status: Single    Spouse name: Not on file   Number of children: 0   Years of education: Not on file   Highest education level: Associate degree: occupational, Scientist, product/process development, or vocational program  Occupational History   Not on file  Tobacco Use   Smoking status: Never   Smokeless tobacco: Never  Vaping Use   Vaping Use: Never used  Substance and Sexual Activity   Alcohol use: No   Drug use: No   Sexual activity: Never    Birth  control/protection: None  Other Topics Concern   Not on file  Social History Narrative   Not on file   Social Determinants of Health   Financial Resource Strain: Not on file  Food Insecurity: Not on file  Transportation Needs: Not on file  Physical Activity: Not on file  Stress: Not on file  Social Connections: Not on file    Allergies:  Allergies  Allergen Reactions   Amoxicillin Rash    Metabolic Disorder Labs: Lab Results  Component Value Date   HGBA1C 5.1 11/18/2014   MPG 100 11/18/2014   Lab Results  Component Value  Date   PROLACTIN 17.6 11/18/2014   Lab Results  Component Value Date   CHOL 112 09/30/2014   TRIG 126 09/30/2014   HDL 37 09/30/2014   CHOLHDL 3.0 09/30/2014   VLDL 25 09/30/2014   LDLCALC 50 09/30/2014   Lab Results  Component Value Date   TSH 1.135 11/18/2014    Therapeutic Level Labs: No results found for: LITHIUM No results found for: VALPROATE No components found for:  CBMZ  Current Medications: Current Outpatient Medications  Medication Sig Dispense Refill   escitalopram (LEXAPRO) 20 MG tablet Take 1 tablet (20 mg total) by mouth daily. 30 tablet 2   escitalopram (LEXAPRO) 5 MG tablet Take 1 tablet (5 mg total) by mouth daily. Take along with Lexapro 20 mg daily to make total daily dose of 25 mg once a day. 30 tablet 2   fexofenadine-pseudoephedrine (ALLEGRA-D ALLERGY & CONGESTION) 180-240 MG 24 hr tablet Take 1 tablet by mouth daily. 90 tablet 4   fluticasone (FLONASE) 50 MCG/ACT nasal spray USE 1 SPRAY IN EACH NOSTRIL TWICE A DAY (FOLLOWING SINUS RINSES) 16 g 2   hydrOXYzine (ATARAX/VISTARIL) 25 MG tablet Take 1 tablet (25 mg total) by mouth every 8 (eight) hours as needed for anxiety. 30 tablet 0   Melatonin 5 MG TABS Take 10 mg by mouth at bedtime.     No current facility-administered medications for this visit.     Musculoskeletal:  Gait & Station: unable to assess since visit was over the telemedicine. Patient leans: N/A  Psychiatric Specialty Exam: Review of Systems  Constitutional:  Negative for malaise/fatigue.  Neurological:  Negative for seizures.  Psychiatric/Behavioral:  Negative for depression, hallucinations, substance abuse and suicidal ideas. The patient is not nervous/anxious and does not have insomnia.    There were no vitals taken for this visit.There is no height or weight on file to calculate BMI.  General Appearance: Casual and Well Groomed  Eye Contact:  Good  Speech:  Clear and Coherent and Normal Rate  Volume:  Normal  Mood:   "good..."  Affect:  Appropriate, Congruent, and Full Range  Thought Process:  Goal Directed and Linear  Orientation:  Full (Time, Place, and Person)  Thought Content: Logical   Suicidal Thoughts:  No  Homicidal Thoughts:  No  Memory:  Immediate;   Good Recent;   Good Remote;   Good  Judgement:  Good  Insight:  Good  Psychomotor Activity:  Normal  Concentration:  Concentration: Good and Attention Span: Good  Recall:  Good  Fund of Knowledge: Good  Language: Good  Akathisia:  No    AIMS (if indicated): not done  Assets:  Communication Skills Desire for Improvement Financial Resources/Insurance Housing Leisure Time Physical Health Social Support Talents/Skills Transportation Vocational/Educational  ADL's:  Intact  Cognition: WNL  Sleep:  Good   Screenings: GAD-7    Garment/textile technologist  Visit from 01/08/2018 in [facility] at [facility] Visit from 09/16/2017 in [facility] at [facility] Office Visit from 08/24/2016 in [facility] at [facility] Office Visit from 02/15/2016 in [facility] at [facility]  Total GAD-7 Score 10 4 7 3       PHQ2-9    Flowsheet Row Video Visit from 06/30/2020 in [facility] Office Visit from 01/08/2018 in [facility] at [facility] Office Visit from 09/16/2017 in [facility] at [facility] Visit from 08/24/2016 in [facility] at [facility] Visit from 02/15/2016 in [facility] at [facility] Total Score 1 4 2 1 1   PHQ-9 Total Score -- 15 7 5 3         Assessment and Plan: This is a 25 year old Hispanic American, adopted female, from taking a gap year, employed at 23, currently domiciled with parents, with no significant medical history and psychiatric history significant of anxiety, depression and 1 previous psychiatric hospitalization at [facility] for  anxiety, depression and suicidal ideations at the age of 80 has been following with this clinic since the fall of 2019.   Her presentation is consistent with  Recurrent MDD(in remission at present), and generalized/social anxiety d/o with panic attacks which appears stable at present on review of symptoms today on 09/29/20  Treatment Plan Summary:  Problem 1 : Anxiety, chronic and stable - Continue Lexapro 25 mg daily. Discussed 25 mg daily being an off label dose at the last increase.  Discussed potential benefit, side effects, directions for administration, contact with questions/concerns at initiation. - Continue Hydroxyzine 12.5 mg to 25 mg Q8hrs PRN for anxiety, has not used recently and only uses on rare occassions. Discussed risk vs benefits. Pt verbalized understanding..  - Continue ind therapy PRN with18 Sixberry  Problem 2: Depression; recurrent in remission.   - As mentioned above.    -  Recommended to return in 12 weeks or early if needed.   MDM = 2 or more chronic stable conditions + med management.     2020, MD 09/29/2020, 2:56 PM

## 2021-01-05 ENCOUNTER — Telehealth (HOSPITAL_BASED_OUTPATIENT_CLINIC_OR_DEPARTMENT_OTHER): Payer: No Typology Code available for payment source | Admitting: Child and Adolescent Psychiatry

## 2021-01-05 ENCOUNTER — Other Ambulatory Visit: Payer: Self-pay

## 2021-01-05 DIAGNOSIS — F418 Other specified anxiety disorders: Secondary | ICD-10-CM | POA: Diagnosis not present

## 2021-01-05 DIAGNOSIS — F3341 Major depressive disorder, recurrent, in partial remission: Secondary | ICD-10-CM | POA: Diagnosis not present

## 2021-01-05 MED ORDER — ESCITALOPRAM OXALATE 20 MG PO TABS: 20.0000 mg | ORAL_TABLET | Freq: Every day | ORAL | 3 refills | Status: DC

## 2021-01-05 MED ORDER — ESCITALOPRAM OXALATE 5 MG PO TABS: 5.0000 mg | ORAL_TABLET | Freq: Every day | ORAL | 3 refills | Status: DC

## 2021-01-05 NOTE — Progress Notes (Signed)
Virtual Visit via Video Note I connected with Sheri Kline on 01/05/21 at  4:00 PM EDT by a video enabled telemedicine application and verified that I am speaking with the correct person using two identifiers.  Location: Patient: [place] Provider: Office   I discussed the limitations of evaluation and management by telemedicine and the availability of in person appointments. The patient expressed understanding and agreed to proceed.    BH MD/PA/NP OP Progress Note  01/05/2021 4:19 PM Sheri Kline  MRN:  [id number]  Chief Complaint: Medication management follow-up for anxiety and depression.  HPI: Sheri Kline was seen and evaluated over telemedicine encounter for medication management follow-up.  She was at her gym and spoke with this Clinical research associate from a private space.  Sheri Kline reports that she has continued to do well in regards of mood and anxiety.  She denies any new concerns for today's appointment.  She reports that work recently has been more stressful due to limited staff however she believes that it will improve as her workplace has hired some new workers.  She reports that overall she is doing well, recently started going to gym and reports that it has been helpful.  She reports occasional sadness, and anxiety however able to manage it well.  She reports that she has been doing well with her appetite, sleeping well, denies any SI/HI.  She reports that she has continued to stay with her parents and that has been going well.  Other than work-related stress that she denies any other psychosocial stressors recently.  She reports that she drinks about 1 drink 2 or 3 times a week and denies any other substance abuse.  She reports that she has been compliant to her medications, reports that she has not seen her therapist recently because she has been busy but planning to resume once she has more time from work.  Given the stability in her symptoms we discussed to continue with current medications and follow  back again in 3 months or earlier if needed.  She verbalized understanding and agreed with the plan.    Visit Diagnosis:    ICD-10-CM   1. Other specified anxiety disorders  F41.8 escitalopram (LEXAPRO) 20 MG tablet    escitalopram (LEXAPRO) 5 MG tablet    2. Recurrent major depressive disorder, in partial remission (HCC)  F33.41 escitalopram (LEXAPRO) 5 MG tablet       Past Psychiatric History: Reviewed today and no change from the last note. Not in counseling at this time, but would seek counseling if needed in future at the counseling center.  Past Medical History:  Past Medical History:  Diagnosis Date   Acne vulgaris 09/30/2014   Adjustment disorder with mixed anxiety and depressed mood 12/17/2012   Adopted 05/11/2013   Anxiety    Hx of psychiatric hospitalization 11/2012   Irregular menstrual cycle 11/11/2015   Mild Environmental and seasonal allergies 11/11/2015   Sleep disturbance 02/17/2013    Past Surgical History:  Procedure Laterality Date   ESOPHAGOGASTRODUODENOSCOPY ENDOSCOPY      Family Psychiatric History: As mentioned in initial H&P, reviewed today, no change   Family History:  Family History  Adopted: Yes  Problem Relation Age of Onset   Alcohol abuse Father     Social History:  Social History   Socioeconomic History   Marital status: Single    Spouse name: Not on file   Number of children: 0   Years of education: Not on file   Highest  education level: Associate degree: occupational, Scientist, product/process development, or vocational program  Occupational History   Not on file  Tobacco Use   Smoking status: Never   Smokeless tobacco: Never  Vaping Use   Vaping Use: Never used  Substance and Sexual Activity   Alcohol use: No   Drug use: No   Sexual activity: Never    Birth control/protection: None  Other Topics Concern   Not on file  Social History Narrative   Not on file   Social Determinants of Health   Financial Resource Strain: Not on file  Food Insecurity:  Not on file  Transportation Needs: Not on file  Physical Activity: Not on file  Stress: Not on file  Social Connections: Not on file    Allergies:  Allergies  Allergen Reactions   Amoxicillin Rash    Metabolic Disorder Labs: Lab Results  Component Value Date   HGBA1C 5.1 11/18/2014   MPG 100 11/18/2014   Lab Results  Component Value Date   PROLACTIN 17.6 11/18/2014   Lab Results  Component Value Date   CHOL 112 09/30/2014   TRIG 126 09/30/2014   HDL 37 09/30/2014   CHOLHDL 3.0 09/30/2014   VLDL 25 09/30/2014   LDLCALC 50 09/30/2014   Lab Results  Component Value Date   TSH 1.135 11/18/2014    Therapeutic Level Labs: No results found for: LITHIUM No results found for: VALPROATE No components found for:  CBMZ  Current Medications: Current Outpatient Medications  Medication Sig Dispense Refill   escitalopram (LEXAPRO) 20 MG tablet Take 1 tablet (20 mg total) by mouth daily. 30 tablet 3   escitalopram (LEXAPRO) 5 MG tablet Take 1 tablet (5 mg total) by mouth daily. Take along with Lexapro 20 mg daily to make total daily dose of 25 mg once a day. 30 tablet 3   fexofenadine-pseudoephedrine (ALLEGRA-D ALLERGY & CONGESTION) 180-240 MG 24 hr tablet Take 1 tablet by mouth daily. 90 tablet 4   fluticasone (FLONASE) 50 MCG/ACT nasal spray USE 1 SPRAY IN EACH NOSTRIL TWICE A DAY (FOLLOWING SINUS RINSES) 16 g 2   hydrOXYzine (ATARAX/VISTARIL) 25 MG tablet Take 1 tablet (25 mg total) by mouth every 8 (eight) hours as needed for anxiety. 30 tablet 0   Melatonin 10 MG TABS See admin instructions.     No current facility-administered medications for this visit.     Musculoskeletal:  Gait & Station: unable to assess since visit was over the telemedicine. Patient leans: N/A  Psychiatric Specialty Exam: Review of Systems  Constitutional:  Negative for malaise/fatigue.  Neurological:  Negative for seizures.  Psychiatric/Behavioral:  Negative for depression, hallucinations,  substance abuse and suicidal ideas. The patient is not nervous/anxious and does not have insomnia.    There were no vitals taken for this visit.There is no height or weight on file to calculate BMI.  General Appearance: Casual and Well Groomed  Eye Contact:  Good  Speech:  Clear and Coherent and Normal Rate  Volume:  Normal  Mood:  "good..."  Affect:  Appropriate, Congruent, and Full Range  Thought Process:  Goal Directed and Linear  Orientation:  Full (Time, Place, and Person)  Thought Content: Logical   Suicidal Thoughts:  No  Homicidal Thoughts:  No  Memory:  Immediate;   Good Recent;   Good Remote;   Good  Judgement:  Good  Insight:  Good  Psychomotor Activity:  Normal  Concentration:  Concentration: Good and Attention Span: Good  Recall:  Good  Fund  of Knowledge: Good  Language: Good  Akathisia:  No    AIMS (if indicated): not done  Assets:  Communication Skills Desire for Improvement Financial Resources/Insurance Housing Leisure Time Physical Health Social Support Talents/Skills Transportation Vocational/Educational  ADL's:  Intact  Cognition: WNL  Sleep:  Good   Screenings: GAD-7    Flowsheet Row Office Visit from 01/08/2018 in [facility] Office Visit from 09/16/2017 in [facility] at [facility] Visit from 08/24/2016 in [facility] Visit from 02/15/2016 in [facility] at [facility]  Total GAD-7 Score 10 4 7 3       PHQ2-9    Flowsheet Row Video Visit from 06/30/2020 in [facility] Office Visit from 01/08/2018 in [facility] at [facility] Visit from 09/16/2017 in [facility] at [facility] Visit from 08/24/2016 in [facility] at [facility] Visit from 02/15/2016 in [facility] at [facility] Total Score 1 4 2 1 1   PHQ-9 Total Score -- 15 7 5 3          Assessment and Plan: This is a 25 year old Hispanic American, adopted female, from taking a gap year, employed at 23, currently domiciled with parents, with no significant medical history and psychiatric history significant of anxiety, depression and 1 previous psychiatric hospitalization at [facility] for anxiety, depression and suicidal ideations at the age of 61 has been following with this clinic since the fall of 2019.   Her presentation is consistent with  Recurrent MDD(in remission at present), and generalized/social anxiety d/o with panic attack.   Update on 09/22 - She appears to have continued stability in mood and anxiety at present. Plan as below.    Treatment Plan Summary:  Problem 1 : Anxiety, chronic and stable - Continue Lexapro 25 mg daily. Discussed 25 mg daily being an off label dose at the last increase.  Discussed potential benefit, side effects, directions for administration, contact with questions/concerns at initiation. - Continue Hydroxyzine 12.5 mg to 25 mg Q8hrs PRN for anxiety, has not used recently and only uses on rare occassions. Discussed risk vs benefits. Pt verbalized understanding..  - Continue ind therapy PRN with18 Six[facility]  Problem 2: Depression; recurrent in remission.   - As mentioned above.   -  Recommended to return in 12 weeks or early if needed.       2020, MD 01/05/2021, 4:19 PM

## 2021-04-12 ENCOUNTER — Telehealth: Payer: No Typology Code available for payment source | Admitting: Child and Adolescent Psychiatry

## 2021-04-12 ENCOUNTER — Other Ambulatory Visit: Payer: Self-pay

## 2021-06-23 ENCOUNTER — Other Ambulatory Visit: Payer: Self-pay | Admitting: Child and Adolescent Psychiatry

## 2021-06-23 DIAGNOSIS — F3341 Major depressive disorder, recurrent, in partial remission: Secondary | ICD-10-CM

## 2021-06-23 DIAGNOSIS — F418 Other specified anxiety disorders: Secondary | ICD-10-CM

## 2021-06-23 NOTE — Telephone Encounter (Signed)
Please call her on Monday and schedule a follow up appointment for future refills for medications. Thanks ?

## 2021-07-13 ENCOUNTER — Telehealth: Payer: Self-pay | Admitting: Child and Adolescent Psychiatry

## 2021-07-13 NOTE — Telephone Encounter (Signed)
Left VM on patient's phone number of record [phone] requesting call back to schedule appointment with provider at provider's request.  ?

## 2021-07-13 NOTE — Telephone Encounter (Signed)
Ok, thanks.

## 2021-07-28 ENCOUNTER — Other Ambulatory Visit: Payer: Self-pay | Admitting: Child and Adolescent Psychiatry

## 2021-07-28 DIAGNOSIS — F418 Other specified anxiety disorders: Secondary | ICD-10-CM

## 2021-07-28 DIAGNOSIS — F3341 Major depressive disorder, recurrent, in partial remission: Secondary | ICD-10-CM

## 2021-07-28 NOTE — Telephone Encounter (Signed)
Can you please contact pt to schedule a follow up appointment. Thanks

## 2021-09-04 ENCOUNTER — Other Ambulatory Visit: Payer: Self-pay | Admitting: Child and Adolescent Psychiatry

## 2021-09-04 DIAGNOSIS — F3341 Major depressive disorder, recurrent, in partial remission: Secondary | ICD-10-CM

## 2021-09-04 DIAGNOSIS — F418 Other specified anxiety disorders: Secondary | ICD-10-CM

## 2021-09-04 NOTE — Telephone Encounter (Signed)
Can you please call her and ask to schedule an appointment? Thanks

## 2021-09-06 ENCOUNTER — Encounter: Payer: Self-pay | Admitting: Child and Adolescent Psychiatry

## 2021-09-06 ENCOUNTER — Ambulatory Visit (INDEPENDENT_AMBULATORY_CARE_PROVIDER_SITE_OTHER): Payer: No Typology Code available for payment source | Admitting: Child and Adolescent Psychiatry

## 2021-09-06 DIAGNOSIS — F418 Other specified anxiety disorders: Secondary | ICD-10-CM | POA: Diagnosis not present

## 2021-09-06 DIAGNOSIS — F3341 Major depressive disorder, recurrent, in partial remission: Secondary | ICD-10-CM | POA: Diagnosis not present

## 2021-09-06 MED ORDER — ESCITALOPRAM OXALATE 5 MG PO TABS: ORAL_TABLET | ORAL | 1 refills | Status: DC

## 2021-09-06 MED ORDER — ESCITALOPRAM OXALATE 20 MG PO TABS: ORAL_TABLET | ORAL | 1 refills | Status: DC

## 2021-09-06 NOTE — Progress Notes (Signed)
BH MD/PA/NP OP Progress Note  09/06/2021 2:30 PM Sheri Kline  MRN:  [id number]  Chief Complaint: Medication management follow-up for anxiety and depression.  HPI: Sheri Kline was seen and evaluated in office for follow-up appointment.  She was last seen in September last year and missed one of her appointment and presents today for follow-up.    She reports that she has continued taking Lexapro 25 mg in the interim since the last appointment however ran out of it about 3 to 4 days ago and could not fill the prescription because it was denied.  I discussed with her that because she was not seen for over the last 6 to 8 months therefore [facility] cannot automatically refill prescriptions.  She verbalized understanding.  She reports that since she ran out of her medication she has noticed herself being more emotional and anxious but prior to that she has been doing well.  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  She reports that overall she is doing well in regards of her mood, does have occasional episodes of depressed mood occurring about once or twice a month, lasting for about 1 to 2 days.  Other than that she denies having any depressive episodes.  She reports that anxiety can increase intermittently in the context of work but overall manageable.  She reports that she has been sleeping well, eating well, still goes to gym when she gets free time, continues to stay with her parents and things are going well there.  She reports that she still has intermittent passive SI without any active suicidal thoughts, intent or plan which occurs whenever she feels more down than usual.  She reports that she just lets these thoughts pass and she eventually feels better.    We discussed to restart Lexapro 20 mg once a day for the next 2 days and then increase it to 25 mg from third day.  She verbalized  understanding.  She has not been using hydroxyzine for sleep and doing well without it.  She reports that she has not seen a therapist as well because she has been doing well.  She will follow back again in 3 months or earlier if needed.   Visit Diagnosis:    ICD-10-CM   1. Other specified anxiety disorders  F41.8 escitalopram (LEXAPRO) 20 MG tablet    escitalopram (LEXAPRO) 5 MG tablet    2. Recurrent major depressive disorder, in partial remission (HCC)  F33.41 escitalopram (LEXAPRO) 5 MG tablet       Past Psychiatric History: Reviewed today and no change from the last note. Not in counseling at this time, but would seek counseling if needed in future at the counseling center.  Past Medical History:  Past Medical History:  Diagnosis Date   Acne vulgaris 09/30/2014   Adjustment disorder with mixed anxiety and depressed mood 12/17/2012   Adopted 05/11/2013   Anxiety    Hx of psychiatric hospitalization 11/2012   Irregular menstrual cycle 11/11/2015   Mild Environmental and seasonal allergies 11/11/2015   Sleep disturbance 02/17/2013    Past Surgical History:  Procedure Laterality Date   ESOPHAGOGASTRODUODENOSCOPY ENDOSCOPY      Family Psychiatric History: As mentioned in initial H&P, reviewed today, no change   Family History:  Family History  Adopted: Yes  Problem Relation Age of Onset   Alcohol abuse Father  Social History:  Social History   Socioeconomic History   Marital status: Single    Spouse name: Not on file   Number of children: 0   Years of education: Not on file   Highest education level: Associate degree: occupational, Scientist, product/process development, or vocational program  Occupational History   Not on file  Tobacco Use   Smoking status: Never   Smokeless tobacco: Never  Vaping Use   Vaping Use: Never used  Substance and Sexual Activity   Alcohol use: No   Drug use: No   Sexual activity: Never    Birth control/protection: None  Other Topics Concern   Not on file   Social History Narrative   Not on file   Social Determinants of Health   Financial Resource Strain: Not on file  Food Insecurity: Not on file  Transportation Needs: Not on file  Physical Activity: Not on file  Stress: Not on file  Social Connections: Not on file    Allergies:  Allergies  Allergen Reactions   Amoxicillin Rash    Metabolic Disorder Labs: Lab Results  Component Value Date   HGBA1C 5.1 11/18/2014   MPG 100 11/18/2014   Lab Results  Component Value Date   PROLACTIN 17.6 11/18/2014   Lab Results  Component Value Date   CHOL 112 09/30/2014   TRIG 126 09/30/2014   HDL 37 09/30/2014   CHOLHDL 3.0 09/30/2014   VLDL 25 09/30/2014   LDLCALC 50 09/30/2014   Lab Results  Component Value Date   TSH 1.135 11/18/2014    Therapeutic Level Labs: No results found for: LITHIUM No results found for: VALPROATE No components found for:  CBMZ  Current Medications: Current Outpatient Medications  Medication Sig Dispense Refill   escitalopram (LEXAPRO) 20 MG tablet TAKE 1 TABLET BY MOUTH DAILY. TAKE WITH ESCITALOPRAM 5 MG TABLET FOR TOTAL OF 25 MG. 90 tablet 1   escitalopram (LEXAPRO) 5 MG tablet TAKE 1 TABLET BY MOUTH DAILY. TAKE ALONG WITH ESCITALOPRAM 20 MG DAILY TO MAKE TOTAL DAILY DOSE OF 25 MG ONCE A DAY. 90 tablet 1   fexofenadine-pseudoephedrine (ALLEGRA-D ALLERGY & CONGESTION) 180-240 MG 24 hr tablet Take 1 tablet by mouth daily. 90 tablet 4   fluticasone (FLONASE) 50 MCG/ACT nasal spray USE 1 SPRAY IN EACH NOSTRIL TWICE A DAY (FOLLOWING SINUS RINSES) 16 g 2   hydrOXYzine (ATARAX/VISTARIL) 25 MG tablet Take 1 tablet (25 mg total) by mouth every 8 (eight) hours as needed for anxiety. 30 tablet 0   Melatonin 10 MG TABS See admin instructions.     No current facility-administered medications for this visit.     Musculoskeletal:  Gait & Station: WNL Patient leans: N/A  Psychiatric Specialty Exam: Review of Systems  Constitutional:  Negative for  malaise/fatigue.  Neurological:  Negative for seizures.  Psychiatric/Behavioral:  Negative for depression, hallucinations, substance abuse and suicidal ideas. The patient is not nervous/anxious and does not have insomnia.    Blood pressure 102/66, pulse 70, temperature 98.7 F (37.1 C), temperature source Temporal, height 5' 6.54" (1.69 m), weight 164 lb 12.8 oz (74.8 kg), last menstrual period 08/07/2021.Body mass index is 26.17 kg/m.  General Appearance: Casual and Well Groomed  Eye Contact:  Good  Speech:  Clear and Coherent and Normal Rate  Volume:  Normal  Mood:  "good.."  Affect:  Appropriate, Congruent, and Full Range  Thought Process:  Goal Directed and Linear  Orientation:  Full (Time, Place, and Person)  Thought Content: Logical  Suicidal Thoughts:  No  Homicidal Thoughts:  No  Memory:  Immediate;   Good Recent;   Good Remote;   Good  Judgement:  Good  Insight:  Good  Psychomotor Activity:  Normal  Concentration:  Concentration: Good and Attention Span: Good  Recall:  Good  Fund of Knowledge: Good  Language: Good  Akathisia:  No    AIMS (if indicated): not done  Assets:  Communication Skills Desire for Improvement Financial Resources/Insurance Housing Leisure Time [place] Talents/Skills Transportation Vocational/Educational  ADL's:  Intact  Cognition: WNL  Sleep:  Good   Screenings: GAD-7    Flowsheet Row Office Visit from 01/08/2018 in [place] [facility] [place] Visit from 09/16/2017 in [place] at [place] Visit from 08/24/2016 in [place] at [facility] Visit from 02/15/2016 in [place] at [facility]  Total GAD-7 Score 10 4 7 3       [place] Office Visit from 09/06/2021 in [place] [facility] Visit from 06/30/2020 in [place] Office Visit from [facility]/08[facility]2018 [facility] [place] [facility] Visit from 09/16/2017 in [place] at [facility] Visit from 08/24/2016 in [place] at [facility] Total Score 1 1 4 2 1   PHQ-9 Total Score 3 -- 15 7 5         Assessment and Plan: This is a 26 year old Hispanic American, adopted female, 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   Her presentation is consistent with  Recurrent MDD(in remission at present), and generalized/social anxiety d/o with panic attack.   Update on 05/24 - She appears to have continued stability in mood and anxiety at present. Plan as below.    Treatment Plan Summary:  Problem 1 : Anxiety, chronic and stable - Continue Lexapro 25 mg daily. She will take Lexapro 20 mg for about 2 days before increasing back to 25 mg daily. Discussed 25 mg daily being an off label dose at the last increase.  Discussed potential benefit, side effects, directions for administration, contact with questions/concerns at initiation. - Continue Hydroxyzine 12.5 mg to 25 mg Q8hrs PRN for anxiety, has not used recently and only uses on rare occassions. Discussed risk vs benefits. Pt verbalized understanding..  - Continue ind therapy PRN withJuliane Lack Sixberry  Problem 2: Depression; recurrent in remission.   - As mentioned above.    MDM = 2 or more chronic stable conditions + med management  -  Recommended to return in 12 weeks or early if needed.       Sheri Erm, MD 09/06/2021, 2:30 PM

## 2021-12-07 ENCOUNTER — Ambulatory Visit: Payer: No Typology Code available for payment source | Admitting: Child and Adolescent Psychiatry

## 2022-05-14 ENCOUNTER — Encounter: Payer: Self-pay | Admitting: Child and Adolescent Psychiatry

## 2022-05-14 ENCOUNTER — Ambulatory Visit (INDEPENDENT_AMBULATORY_CARE_PROVIDER_SITE_OTHER): Payer: No Typology Code available for payment source | Admitting: Child and Adolescent Psychiatry

## 2022-05-14 DIAGNOSIS — F418 Other specified anxiety disorders: Secondary | ICD-10-CM | POA: Diagnosis not present

## 2022-05-14 DIAGNOSIS — F3341 Major depressive disorder, recurrent, in partial remission: Secondary | ICD-10-CM

## 2022-05-14 MED ORDER — ESCITALOPRAM OXALATE 5 MG PO TABS: ORAL_TABLET | ORAL | 1 refills | Status: DC

## 2022-05-14 MED ORDER — ESCITALOPRAM OXALATE 20 MG PO TABS: ORAL_TABLET | ORAL | 1 refills | Status: DC

## 2022-05-14 NOTE — Progress Notes (Signed)
BH MD/PA/NP OP Progress Note  05/14/2022 1:26 PM Sheri Kline  MRN:  [id number]  Chief Complaint: Medication management follow-up for anxiety and depression.  HPI: Sheri Kline was seen and evaluated in office for follow-up appointment.  She was last seen in May last year and presents today for her follow-up appointment.  She reports that she has continued taking Lexapro 25 mg once a day since the last appointment.  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  She rates her anxiety around 4 out of 10, 10 being most anxious and rates 3 on GAD-7.  She also denies any problems with her mood, denies any low lows or depressed mood.  She says that in her free time she plays video games, takes care of her chores.  She enjoys these activities.  She denies any problems with sleep, sleep is restful.  She denies problems with energy.  She denies any SI or HI.  She scored 0 on PHQ-2 and 1 on PHQ-9.  Because of her continued remission in depressive symptoms and stability with anxiety, we discussed to continue with Lexapro 25 mg daily and follow back again in about 6 months or earlier if needed.   Visit Diagnosis:    ICD-10-CM   1. Other specified anxiety disorders  F41.8 escitalopram (LEXAPRO) 20 MG tablet    escitalopram (LEXAPRO) 5 MG tablet    2. Recurrent major depressive disorder, in partial remission (HCC)  F33.41 escitalopram (LEXAPRO) 5 MG tablet       Past Psychiatric History: Reviewed today and no change from the last note. Not in counseling at this time, but would seek counseling if needed in future at the counseling center.  Past Medical History:  Past Medical History:  Diagnosis Date   Acne vulgaris 09/30/2014   Adjustment disorder  with mixed anxiety and depressed mood 12/17/2012   Adopted 05/11/2013   Anxiety    Hx of psychiatric hospitalization 11/2012   Irregular menstrual cycle 11/11/2015   Mild Environmental and seasonal allergies 11/11/2015   Sleep disturbance 02/17/2013    Past Surgical History:  Procedure Laterality Date   ESOPHAGOGASTRODUODENOSCOPY ENDOSCOPY      Family Psychiatric History: As mentioned in initial H&P, reviewed today, no change   Family History:  Family History  Adopted: Yes  Problem Relation Age of Onset   Alcohol abuse Father     Social History:  Social History   Socioeconomic History   Marital status: Single    Spouse name: Not on file   Number of children: 0   Years of education: Not on file   Highest education level: Associate degree: occupational, Hotel manager, or vocational program  Occupational History   Not on file  Tobacco Use   Smoking status: Never   Smokeless tobacco: Never  Vaping Use   Vaping Use: Never used  Substance and Sexual Activity   Alcohol use: No   Drug use: No   Sexual activity: Never    Birth control/protection: None  Other Topics Concern   Not on file  Social History Narrative   Not on file   Social Determinants of Health   Financial Resource Strain: Low Risk  (01/16/2018)   Overall Financial Resource Strain (CARDIA)  Difficulty of Paying Living Expenses: Not hard at all  Food Insecurity: No Food Insecurity (01/16/2018)   Hunger Vital Sign    Worried About Running Out of Food in the Last Year: Never true    Ran Out of Food in the Last Year: Never true  Transportation Needs: No Transportation Needs (01/16/2018)   PRAPARE - Administrator, Civil Service (Medical): No    Lack of Transportation (Non-Medical): No  Physical Activity: Sufficiently Active (01/16/2018)   Exercise Vital Sign    Days of Exercise per Week: 4 days    Minutes of Exercise per Session: 60 min  Stress: Stress Concern Present (01/16/2018)   [employer] of  Occupational Health - Occupational Stress Questionnaire    Feeling of Stress : To some extent  Social Connections: Unknown (01/16/2018)   Social Connection and Isolation Panel [NHANES]    Frequency of Communication with Friends and Family: Not on file    Frequency of Social Gatherings with Friends and Family: Not on file    Attends Religious Services: 1 to 4 times per year    Active Member of [employer] or Organizations: No    Attends Banker Meetings: Never    Marital Status: Never married    Allergies:  Allergies  Allergen Reactions   Penicillin G Nausea And Vomiting   Amoxicillin Rash    Metabolic Disorder Labs: Lab Results  Component Value Date   HGBA1C 5.1 11/18/2014   MPG 100 11/18/2014   Lab Results  Component Value Date   PROLACTIN 17.6 11/18/2014   Lab Results  Component Value Date   CHOL 112 09/30/2014   TRIG 126 09/30/2014   HDL 37 09/30/2014   CHOLHDL 3.0 09/30/2014   VLDL 25 09/30/2014   LDLCALC 50 09/30/2014   Lab Results  Component Value Date   TSH 1.135 11/18/2014    Therapeutic Level Labs: No results found for: "LITHIUM" No results found for: "VALPROATE" No results found for: "CBMZ"  Current Medications: Current Outpatient Medications  Medication Sig Dispense Refill   escitalopram (LEXAPRO) 20 MG tablet TAKE 1 TABLET BY MOUTH DAILY. TAKE WITH ESCITALOPRAM 5 MG TABLET FOR TOTAL OF 25 MG. 90 tablet 1   escitalopram (LEXAPRO) 5 MG tablet TAKE 1 TABLET BY MOUTH DAILY. TAKE ALONG WITH ESCITALOPRAM 20 MG DAILY TO MAKE TOTAL DAILY DOSE OF 25 MG ONCE A DAY. 90 tablet 1   fexofenadine-pseudoephedrine (ALLEGRA-D ALLERGY & CONGESTION) 180-240 MG 24 hr tablet Take 1 tablet by mouth daily. 90 tablet 4   fluticasone (FLONASE) 50 MCG/ACT nasal spray USE 1 SPRAY IN EACH NOSTRIL TWICE A DAY (FOLLOWING SINUS RINSES) 16 g 2   Melatonin 10 MG TABS See admin instructions.     No current facility-administered medications for this visit.      Musculoskeletal:  Gait & Station: WNL Patient leans: N/A  Psychiatric Specialty Exam: Review of Systems  Constitutional:  Negative for malaise/fatigue.  Neurological:  Negative for seizures.  Psychiatric/Behavioral:  Negative for depression, hallucinations, substance abuse and suicidal ideas. The patient is not nervous/anxious and does not have insomnia.     Blood pressure 111/73, pulse 76, temperature 97.8 F (36.6 C), temperature source Temporal, height 5' 6.54" (1.69 m), weight 167 lb 14.4 oz (76.2 kg), last menstrual period 04/14/2022, SpO2 98 %.Body mass index is 26.66 kg/m.  General Appearance: Casual and Well Groomed  Eye Contact:  Good  Speech:  Clear and Coherent and Normal Rate  Volume:  Normal  Mood:  "  good.."  Affect:  Appropriate, Congruent, and Full Range  Thought Process:  Goal Directed and Linear  Orientation:  Full (Time, Place, and Person)  Thought Content: Logical   Suicidal Thoughts:  No  Homicidal Thoughts:  No  Memory:  Immediate;   Good Recent;   Good Remote;   Good  Judgement:  Good  Insight:  Good  Psychomotor Activity:  Normal  Concentration:  Concentration: Good and Attention Span: Good  Recall:  Good  Fund of Knowledge: Good  Language: Good  Akathisia:  No    AIMS (if indicated): not done  Assets:  Communication Skills Desire for Improvement Financial Resources/Insurance Housing Leisure Time Physical Health Social Support Talents/Skills Transportation Vocational/Educational  ADL's:  Intact  Cognition: WNL  Sleep:  Good   Screenings: GAD-7    Flowsheet Row Office Visit from 01/08/2018 in [place] at [place] Visit from 09/16/2017 in [place] at [place] Visit from 08/24/2016 in [place] at [place] Visit from 02/15/2016 in [place] at [facility]  Total GAD-7 Score 10 4 7 3       [place] Office Visit from 09/06/2021 in  [place] Video Visit from 06/30/2020 in [place] Office Visit from 01/08/2018 in [place] at [place] Visit from 09/16/2017 in [place] at [facility] Visit from 08/24/2016 in [place] at [facility] Total Score 1 1 4 2 1   PHQ-9 Total Score 3 -- 15 7 5         Assessment and Plan: This is a 27 year old Hispanic American, 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   Her presentation is consistent with  Recurrent MDD(in remission at present), and generalized/social anxiety d/o with panic attack.   Update on 05/14/2022 -she appears to have continued stability with anxiety in remission and depressive symptoms.  Recommending to continue with medications as mentioned below.     Treatment Plan Summary:  Problem 1 : Anxiety, chronic and stable - Continue Lexapro 25 mg daily. Previously discussed 25 mg daily being an off label dose.  Discussed potential benefit, side effects, directions for administration, contact with questions/concerns at initiation. - Has not needed to use atarax..  - Has not been seeing ind therapist withJuliane Lack Sixberry  Problem 2: Depression; recurrent in remission.   - As mentioned above.    MDM = 2 or more chronic stable conditions + med management  -  Recommended to return in 6 months or early if needed.       Orlene Erm, MD 05/14/2022, 1:26 PM

## 2022-11-12 ENCOUNTER — Ambulatory Visit (INDEPENDENT_AMBULATORY_CARE_PROVIDER_SITE_OTHER): Payer: No Typology Code available for payment source | Admitting: Child and Adolescent Psychiatry

## 2022-11-12 ENCOUNTER — Encounter: Payer: Self-pay | Admitting: Child and Adolescent Psychiatry

## 2022-11-12 DIAGNOSIS — F3341 Major depressive disorder, recurrent, in partial remission: Secondary | ICD-10-CM

## 2022-11-12 DIAGNOSIS — F418 Other specified anxiety disorders: Secondary | ICD-10-CM | POA: Diagnosis not present

## 2022-11-12 MED ORDER — ESCITALOPRAM OXALATE 5 MG PO TABS: ORAL_TABLET | ORAL | 1 refills | Status: AC

## 2022-11-12 MED ORDER — ESCITALOPRAM OXALATE 20 MG PO TABS: ORAL_TABLET | ORAL | 1 refills | Status: AC

## 2022-11-12 NOTE — Progress Notes (Signed)
BH MD/PA/NP OP Progress Note  11/12/2022 1:27 PM Sheri Kline  MRN:  [id number]  Chief Complaint: Medication management follow-up for anxiety and depression.  HPI: Sheri Kline was seen and evaluated in office for follow-up appointment.  She was last seen about 6 months and presented today for her follow-up appointment.  She reported that she has continued to take Lexapro 25 mg daily.  She denies any new concerns for today's appointment.  She reports that she has been doing well, work has been lately stressful but she has been able to manage her anxiety associated with it.  She reports that when she is not at work her anxiety is better.  Also reports that her mood can be "edgy", due to the work-related stress but denies any low lows or depressive episodes since last appointment 6 months ago.  She now has a dog and she enjoys spending time with the dog.  Denies problems with sleep or appetite.  On PHQ-9 she scored total of 2 and on GAD-7 she scored total of 3.  Because of her overall stability we discussed to continue with current medications and follow-up in about 3 months or earlier if needed.  We also discussed potential transition of her care to a nurse practitioner.  She verbalized understanding and agreed on the transfer of care.   Visit Diagnosis:    ICD-10-CM   1. Other specified anxiety disorders  F41.8 escitalopram (LEXAPRO) 5 MG tablet    escitalopram (LEXAPRO) 20 MG tablet    2. Recurrent major depressive disorder, in partial remission (HCC)  F33.41 escitalopram (LEXAPRO) 5 MG tablet        Past Psychiatric History: Reviewed today and no change from the last note. Not in counseling at this time, but would seek counseling if needed in future at the counseling center.  Past Medical History:  Past Medical History:  Diagnosis Date   Acne vulgaris 09/30/2014   Adjustment disorder with mixed anxiety and depressed mood 12/17/2012   Adopted 05/11/2013   Anxiety    Hx of psychiatric  hospitalization 11/2012   Irregular menstrual cycle 11/11/2015   Mild Environmental and seasonal allergies 11/11/2015   Sleep disturbance 02/17/2013    Past Surgical History:  Procedure Laterality Date   ESOPHAGOGASTRODUODENOSCOPY ENDOSCOPY      Family Psychiatric History: As mentioned in initial H&P, reviewed today, no change   Family History:  Family History  Adopted: Yes  Problem Relation Age of Onset   Alcohol abuse Father     Social History:  Social History   Socioeconomic History   Marital status: Single    Spouse name: Not on file   Number of children: 0   Years of education: Not on file   Highest education level: Associate degree: occupational, Scientist, product/process development, or vocational program  Occupational History   Not on file  Tobacco Use   Smoking status: Never   Smokeless tobacco: Never  Vaping Use   Vaping status: Never Used  Substance and Sexual Activity   Alcohol use: No   Drug use: No   Sexual activity: Never    Birth control/protection: None  Other Topics Concern   Not on file  Social History Narrative   Not on file   Social Determinants of Health   Financial Resource Strain: Low Risk  (01/16/2018)   Overall Financial Resource Strain (CARDIA)    Difficulty of Paying Living Expenses: Not hard at all  Food Insecurity: No Food Insecurity (01/16/2018)  Hunger Vital Sign    Worried About Running Out of Food in the Last Year: Never true    Ran Out of Food in the Last Year: Never true  Transportation Needs: No Transportation Needs (01/16/2018)   PRAPARE - Administrator, Civil Service (Medical): No    Lack of Transportation (Non-Medical): No  Physical Activity: Sufficiently Active (01/16/2018)   Exercise Vital Sign    Days of Exercise per Week: 4 days    Minutes of Exercise per Session: 60 min  Stress: Stress Concern Present (01/16/2018)   [employer] of Occupational Health - Occupational Stress Questionnaire    Feeling of Stress : To some extent   Social Connections: Unknown (01/16/2018)   Social Connection and Isolation Panel [NHANES]    Frequency of Communication with Friends and Family: Not on file    Frequency of Social Gatherings with Friends and Family: Not on file    Attends Religious Services: 1 to 4 times per year    Active Member of [employer] or Organizations: No    Attends Banker Meetings: Never    Marital Status: Never married    Allergies:  Allergies  Allergen Reactions   Penicillin G Nausea And Vomiting   Amoxicillin Rash    Metabolic Disorder Labs: Lab Results  Component Value Date   HGBA1C 5.1 11/18/2014   MPG 100 11/18/2014   Lab Results  Component Value Date   PROLACTIN 17.6 11/18/2014   Lab Results  Component Value Date   CHOL 112 09/30/2014   TRIG 126 09/30/2014   HDL 37 09/30/2014   CHOLHDL 3.0 09/30/2014   VLDL 25 09/30/2014   LDLCALC 50 09/30/2014   Lab Results  Component Value Date   TSH 1.135 11/18/2014    Therapeutic Level Labs: No results found for: "LITHIUM" No results found for: "VALPROATE" No results found for: "CBMZ"  Current Medications: Current Outpatient Medications  Medication Sig Dispense Refill   fexofenadine-pseudoephedrine (ALLEGRA-D ALLERGY & CONGESTION) 180-240 MG 24 hr tablet Take 1 tablet by mouth daily. 90 tablet 4   fluticasone (FLONASE) 50 MCG/ACT nasal spray USE 1 SPRAY IN EACH NOSTRIL TWICE A DAY (FOLLOWING SINUS RINSES) 16 g 2   Melatonin 10 MG TABS See admin instructions.     escitalopram (LEXAPRO) 20 MG tablet TAKE 1 TABLET BY MOUTH DAILY. TAKE WITH ESCITALOPRAM 5 MG TABLET FOR TOTAL OF 25 MG. 90 tablet 1   escitalopram (LEXAPRO) 5 MG tablet TAKE 1 TABLET BY MOUTH DAILY. TAKE ALONG WITH ESCITALOPRAM 20 MG DAILY TO MAKE TOTAL DAILY DOSE OF 25 MG ONCE A DAY. 90 tablet 1   No current facility-administered medications for this visit.     Musculoskeletal:  Gait & Station: WNL Patient leans: N/A  Psychiatric Specialty Exam: Review of  Systems  Constitutional:  Negative for malaise/fatigue.  Neurological:  Negative for seizures.  Psychiatric/Behavioral:  Negative for depression, hallucinations, substance abuse and suicidal ideas. The patient is not nervous/anxious and does not have insomnia.     Blood pressure 112/73, pulse 61, temperature 97.8 F (36.6 C), temperature source Skin, height 5' 6.54" (1.69 m), weight 170 lb 6.4 oz (77.3 kg).Body mass index is 27.06 kg/m.  General Appearance: Casual and Well Groomed  Eye Contact:  Good  Speech:  Clear and Coherent and Normal Rate  Volume:  Normal  Mood:  "good.."  Affect:  Appropriate, Congruent, and Full Range  Thought Process:  Goal Directed and Linear  Orientation:  Full (Time, Place, and  Person)  Thought Content: Logical   Suicidal Thoughts:  No  Homicidal Thoughts:  No  Memory:  Immediate;   Good Recent;   Good Remote;   Good  Judgement:  Good  Insight:  Good  Psychomotor Activity:  Normal  Concentration:  Concentration: Good and Attention Span: Good  Recall:  Good  Fund of Knowledge: Good  Language: Good  Akathisia:  No    AIMS (if indicated): not done  Assets:  Communication Skills Desire for Improvement Financial Resources/Insurance Housing Leisure Time Physical Health Social Support Talents/Skills Transportation Vocational/Educational  ADL's:  Intact  Cognition: WNL  Sleep:  Good   Screenings: GAD-7    Flowsheet Row Office Visit from 01/08/2018 in [facility] Office Visit from 09/16/2017 in [facility] Office Visit from 08/24/2016 in [facility] at [facility] Office Visit from 02/15/2016 in [facility] at [facility]  Total GAD-7 Score 10 4 7 3       PHQ2-9    Flowsheet Row Office Visit from 09/06/2021 in [facility] Video Visit from 06/30/2020 in [facility] Office Visit from  01/08/2018 in [facility] at [facility] Office Visit from 09/16/2017 in [facility] at [facility] Visit from 08/24/2016 in [facility] at [facility] Total Score 1 1 4 2 1   PHQ-9 Total Score 3 -- 15 7 5         Assessment and Plan: This is a 27 year old Hispanic American, adopted female, Engineer, 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   Her presentation is consistent with  Recurrent MDD(in remission at present), and generalized/social anxiety d/o with panic attack.   Update on 11/12/22 -reviewed response to her current medication and she appears to have continued stability with anxiety and remission and depressive symptoms.  Recommending to continue with them as mentioned below in the plan.    Treatment Plan Summary:  Problem 1 : Anxiety, chronic and stable - Continue Lexapro 25 mg daily. Previously discussed 25 mg daily being an off label dose.  Discussed potential benefit, side effects, directions for administration, contact with questions/concerns at initiation. - Has not needed to use atarax..  - Has not been seeing ind therapist, previously saw- Lysle Morales Sixberry  Problem 2: Depression; recurrent in remission.   - As mentioned above.   Follow up in 3 months.    MDM = 2 or more chronic stable conditions + med management  -  Recommended to return in 6 months or early if needed.       Darcel Smalling, MD 11/12/2022, 1:27 PM

## 2023-02-06 ENCOUNTER — Ambulatory Visit: Payer: No Typology Code available for payment source | Admitting: Child and Adolescent Psychiatry
